# Patient Record
Sex: Male | Born: 1995 | Race: White | Hispanic: No | Marital: Single | Smoking: Current some day smoker
Health system: Southern US, Community
[De-identification: ages and names within clinical notes are randomized; demographics above are authoritative.]

## PROBLEM LIST (undated history)

## (undated) HISTORY — PX: KNEE SURGERY: SHX244

## (undated) HISTORY — PX: OTHER SURGICAL HISTORY: SHX169

---
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
## 2014-02-06 ENCOUNTER — Ambulatory Visit (INDEPENDENT_AMBULATORY_CARE_PROVIDER_SITE_OTHER): Payer: Medicaid Other | Admitting: Family Medicine

## 2014-02-06 VITALS — BP 125/65 | HR 71 | Temp 98.6°F | Wt 141.0 lb

## 2014-02-06 DIAGNOSIS — G8929 Other chronic pain: Secondary | ICD-10-CM

## 2014-02-06 DIAGNOSIS — M25561 Pain in right knee: Secondary | ICD-10-CM

## 2014-02-06 MED ORDER — NAPROXEN 500 MG PO TABS: 500.0000 mg | ORAL_TABLET | Freq: Two times a day (BID) | ORAL | Status: DC

## 2014-02-06 MED ORDER — HYDROCODONE-ACETAMINOPHEN 5-325 MG PO TABS: 1.0000 | ORAL_TABLET | Freq: Four times a day (QID) | ORAL | Status: DC | PRN

## 2014-02-06 NOTE — Progress Notes (Signed)
   Subjective:    Patient ID: Eddie Perry, male    DOB: 1995/08/30, 18 y.o.   MRN: [id number]  HPI  C/o chronic right knee pain.  He was originally seen for this in 2013 and states he had surgery and now the knee is acting up again. Review of Systems  Constitutional: Negative for fever.  HENT: Negative for ear pain.   Eyes: Negative for discharge.  Respiratory: Negative for cough.   Cardiovascular: Negative for chest pain.  Gastrointestinal: Negative for abdominal distention.  Endocrine: Negative for polyuria.  Genitourinary: Negative for difficulty urinating.  Musculoskeletal: Negative for gait problem and neck pain.  Skin: Negative for color change and rash.  Neurological: Negative for speech difficulty and headaches.  Psychiatric/Behavioral: Negative for agitation.       Objective:    BP 125/65 mmHg  Pulse 71  Temp(Src) 98.6 F (37 C) (Oral)  Wt 141 lb (63.957 kg) Physical Exam  Constitutional: He is oriented to person, place, and time. He appears well-developed and well-nourished.  HENT:  Head: Normocephalic and atraumatic.  Mouth/Throat: Oropharynx is clear and moist.  Eyes: Pupils are equal, round, and reactive to light.  Neck: Normal range of motion. Neck supple.  Cardiovascular: Normal rate and regular rhythm.   No murmur heard. Pulmonary/Chest: Effort normal and breath sounds normal.  Abdominal: Soft. Bowel sounds are normal. There is no tenderness.  Neurological: He is alert and oriented to person, place, and time.  Skin: Skin is warm and dry.  Psychiatric: He has a normal mood and affect.          Assessment & Plan:     ICD-9-CM ICD-10-CM   1. Knee pain, chronic, right 719.46 M25.561 Ambulatory referral to Orthopedic Surgery   338.29 G89.29 HYDROcodone-acetaminophen (NORCO/VICODIN) 5-325 MG per tablet     naproxen (NAPROSYN) 500 MG tablet     No Follow-up on file.  Deatra CanterWilliam J Oxford FNP

## 2014-02-06 NOTE — Progress Notes (Signed)
   Subjective:    Patient ID: Eddie Perry, male    DOB: Jun 30, 1995, 18 y.o.   MRN: [id number]  HPI C/o right knee pain and discomfort and when he steps down he has a crack and pop.  He has had orthoscopic surgery in 2013. He has been having discomfort whic has wosened.  Review of Systems     Objective:    BP 125/65 mmHg  Pulse 71  Temp(Src) 98.6 F (37 C) (Oral)  Wt 141 lb (63.957 kg) Physical Exam        Assessment & Plan:  No diagnosis found.   No Follow-up on file.  Deatra CanterWilliam J Oxford FNP

## 2014-02-22 ENCOUNTER — Other Ambulatory Visit (HOSPITAL_COMMUNITY): Payer: Self-pay | Admitting: Orthopedic Surgery

## 2014-02-22 ENCOUNTER — Ambulatory Visit (HOSPITAL_COMMUNITY)
Admission: RE | Admit: 2014-02-22 | Discharge: 2014-02-22 | Disposition: A | Discharge status: Home / Self Care | Payer: Medicaid Other | Source: Ambulatory Visit | Attending: Orthopedic Surgery | Admitting: Orthopedic Surgery

## 2014-02-22 DIAGNOSIS — M25561 Pain in right knee: Secondary | ICD-10-CM

## 2014-02-22 DIAGNOSIS — Z01818 Encounter for other preprocedural examination: Secondary | ICD-10-CM | POA: Diagnosis present

## 2014-07-11 ENCOUNTER — Ambulatory Visit: Payer: Medicaid Other | Admitting: Nurse Practitioner

## 2014-08-02 ENCOUNTER — Ambulatory Visit: Payer: Medicaid Other | Admitting: Nurse Practitioner

## 2014-08-23 ENCOUNTER — Ambulatory Visit (INDEPENDENT_AMBULATORY_CARE_PROVIDER_SITE_OTHER): Payer: Medicaid Other | Admitting: Family

## 2014-08-23 ENCOUNTER — Encounter: Payer: Self-pay | Admitting: Family

## 2014-08-23 VITALS — BP 115/70 | HR 72 | Temp 97.5°F | Ht 66.0 in | Wt 134.8 lb

## 2014-08-23 DIAGNOSIS — R112 Nausea with vomiting, unspecified: Secondary | ICD-10-CM | POA: Diagnosis not present

## 2014-08-23 DIAGNOSIS — R52 Pain, unspecified: Secondary | ICD-10-CM | POA: Diagnosis not present

## 2014-08-23 DIAGNOSIS — R1011 Right upper quadrant pain: Secondary | ICD-10-CM | POA: Diagnosis not present

## 2014-08-23 LAB — POCT CBC
GRANULOCYTE PERCENT: 68.5 % (ref 37–80)
HCT, POC: 48.2 % (ref 43.5–53.7)
Hemoglobin: 15.2 g/dL (ref 14.1–18.1)
Lymph, poc: 2.1 (ref 0.6–3.4)
MCH, POC: 26.5 pg — AB (ref 27–31.2)
MCHC: 31.5 g/dL — AB (ref 31.8–35.4)
MCV: 84 fL (ref 80–97)
MPV: 8 fL (ref 0–99.8)
PLATELET COUNT, POC: 251 10*3/uL (ref 142–424)
POC GRANULOCYTE: 5.4 (ref 2–6.9)
POC LYMPH %: 26.5 % (ref 10–50)
RBC: 5.74 M/uL (ref 4.69–6.13)
RDW, POC: 13.9 %
WBC: 7.9 10*3/uL (ref 4.6–10.2)

## 2014-08-23 LAB — POCT INFLUENZA A/B
Influenza A, POC: NEGATIVE
Influenza B, POC: NEGATIVE

## 2014-08-23 NOTE — Progress Notes (Signed)
   Subjective:    Patient ID: Eddie Perry, male    DOB: 1995-11-27, 19 y.o.   MRN: [id number]  HPI Pt presents to the office today for generalized weakness, nausea, vomiting, stomach pain, and chills that started last night. Pt denies any fevers. Pt states he is having a intermittent cramping abdomen pain. Pt states it "feels like someone hitting my in the stomach". Pt denies any  palpitations, SOB, or edema at this time.     Review of Systems  Constitutional: Negative.   HENT: Negative.   Respiratory: Negative.   Cardiovascular: Negative.   Gastrointestinal: Negative.   Endocrine: Negative.   Genitourinary: Negative.   Musculoskeletal: Negative.   Neurological: Negative.   Hematological: Negative.   Psychiatric/Behavioral: Negative.   All other systems reviewed and are negative.      Objective:   Physical Exam  Constitutional: He is oriented to person, place, and time. He appears well-developed and well-nourished. He has a sickly appearance. He appears ill. No distress.  HENT:  Head: Normocephalic.  Eyes: Pupils are equal, round, and reactive to light. Right eye exhibits no discharge. Left eye exhibits no discharge.  Neck: Normal range of motion. Neck supple. No thyromegaly present.  Cardiovascular: Normal rate, regular rhythm, normal heart sounds and intact distal pulses.   No murmur heard. Pulmonary/Chest: Effort normal and breath sounds normal. No respiratory distress. He has no wheezes.  Abdominal: Soft. Bowel sounds are normal. He exhibits no distension. There is tenderness (RUQ pain). There is rebound and guarding.  Musculoskeletal: Normal range of motion. He exhibits no edema or tenderness.  Neurological: He is alert and oriented to person, place, and time. He has normal reflexes. No cranial nerve deficit.  Skin: Skin is warm and dry. No rash noted. No erythema.  Psychiatric: He has a normal mood and affect. His behavior is normal. Judgment and thought content  normal.  Vitals reviewed.     BP 115/70 mmHg  Pulse 72  Temp(Src) 97.5 F (36.4 C) (Oral)  Ht $R'5\' 6"'kj$  (1.676 m)  Wt 134 lb 12.8 oz (61.145 kg)  BMI 21.77 kg/m2     Assessment & Plan:  1. Body aches - POCT Influenza A/B - CT Abdomen Wo Contrast; Future - US Abdomen Complete; Future  2. RUQ pain - POCT CBC - CMP14+EGFR - CT Abdomen Wo Contrast; Future - US Abdomen Complete; Future  3. Non-intractable vomiting with nausea, vomiting of unspecified type - CT Abdomen Wo Contrast; Future - US Abdomen Complete; Future  NPO after midnight for Ultrasound at 9 Am tomorrow If abd pain worsens or changes pt told to go to ED  Evelina Dun, FNP

## 2014-08-23 NOTE — Patient Instructions (Signed)
Cholecystitis Cholecystitis is an inflammation of your gallbladder. It is usually caused by a buildup of gallstones or sludge (cholelithiasis) in your gallbladder. The gallbladder stores a fluid that helps digest fats (bile). Cholecystitis is serious and needs treatment right away.  CAUSES   Gallstones. Gallstones can block the tube that leads to your gallbladder, causing bile to build up. As bile builds up, the gallbladder becomes inflamed.  Bile duct problems, such as blockage from scarring or kinking.  Tumors. Tumors can stop bile from leaving your gallbladder correctly, causing bile to build up. As bile builds up, the gallbladder becomes inflamed. SYMPTOMS   Nausea.  Vomiting.  Abdominal pain, especially in the upper right area of your abdomen.  Abdominal tenderness or bloating.  Sweating.  Chills.  Fever.  Yellowing of the skin and the whites of the eyes (jaundice). DIAGNOSIS  Your caregiver may order blood tests to look for infection or gallbladder problems. Your caregiver may also order imaging tests, such as an ultrasound or computed tomography (CT) scan. Further tests may include a hepatobiliary iminodiacetic acid (HIDA) scan. This scan allows your caregiver to see your bile move from the liver to the gallbladder and to the small intestine. TREATMENT  A hospital stay is usually necessary to lessen the inflammation of your gallbladder. You may be required to not eat or drink (fast) for a certain amount of time. You may be given medicine to treat pain or an antibiotic medicine to treat an infection. Surgery may be needed to remove your gallbladder (cholecystectomy) once the inflammation has gone down. Surgery may be needed right away if you develop complications such as death of gallbladder tissue (gangrene) or a tear (perforation) of the gallbladder.  HOME CARE INSTRUCTIONS  Home care will depend on your treatment. In general:  If you were given antibiotics, take them as  directed. Finish them even if you start to feel better.  Only take over-the-counter or prescription medicines for pain, discomfort, or fever as directed by your caregiver.  Follow a low-fat diet until you see your caregiver again.  Keep all follow-up visits as directed by your caregiver. SEEK IMMEDIATE MEDICAL CARE IF:   Your pain is increasing and not controlled by medicines.  Your pain moves to another part of your abdomen or to your back.  You have a fever.  You have nausea and vomiting. MAKE SURE YOU:  Understand these instructions.  Will watch your condition.  Will get help right away if you are not doing well or get worse. Document Released: 03/24/2005 Document Revised: 06/16/2011 Document Reviewed: 02/07/2011 ExitCare Patient Information 2015 [employer]. This information is not intended to replace advice given to you by your health care provider. Make sure you discuss any questions you have with your health care provider.   Abdominal Pain Many things can cause abdominal pain. Usually, abdominal pain is not caused by a disease and will improve without treatment. It can often be observed and treated at home. Your health care provider will do a physical exam and possibly order blood tests and X-rays to help determine the seriousness of your pain. However, in many cases, more time must pass before a clear cause of the pain can be found. Before that point, your health care provider may not know if you need more testing or further treatment. HOME CARE INSTRUCTIONS  Monitor your abdominal pain for any changes. The following actions may help to alleviate any discomfort you are experiencing:  Only take over-the-counter or prescription medicines   as directed by your health care provider.  Do not take laxatives unless directed to do so by your health care provider.  Try a clear liquid diet (broth, tea, or water) as directed by your health care provider. Slowly move to a bland diet  as tolerated. SEEK MEDICAL CARE IF:  You have unexplained abdominal pain.  You have abdominal pain associated with nausea or diarrhea.  You have pain when you urinate or have a bowel movement.  You experience abdominal pain that wakes you in the night.  You have abdominal pain that is worsened or improved by eating food.  You have abdominal pain that is worsened with eating fatty foods.  You have a fever. SEEK IMMEDIATE MEDICAL CARE IF:   Your pain does not go away within 2 hours.  You keep throwing up (vomiting).  Your pain is felt only in portions of the abdomen, such as the right side or the left lower portion of the abdomen.  You pass bloody or black tarry stools. MAKE SURE YOU:  Understand these instructions.   Will watch your condition.   Will get help right away if you are not doing well or get worse.  Document Released: 01/01/2005 Document Revised: 03/29/2013 Document Reviewed: 12/01/2012 ExitCare Patient Information 2015 [employer]. This information is not intended to replace advice given to you by your health care provider. Make sure you discuss any questions you have with your health care provider.   

## 2014-08-24 ENCOUNTER — Telehealth: Payer: Self-pay | Admitting: Family

## 2014-08-24 ENCOUNTER — Ambulatory Visit (HOSPITAL_COMMUNITY)
Admission: RE | Admit: 2014-08-24 | Discharge: 2014-08-24 | Disposition: A | Discharge status: Home / Self Care | Payer: Medicaid Other | Source: Ambulatory Visit | Attending: Family | Admitting: Family

## 2014-08-24 DIAGNOSIS — R112 Nausea with vomiting, unspecified: Secondary | ICD-10-CM | POA: Diagnosis not present

## 2014-08-24 DIAGNOSIS — R52 Pain, unspecified: Secondary | ICD-10-CM | POA: Diagnosis not present

## 2014-08-24 DIAGNOSIS — R1011 Right upper quadrant pain: Secondary | ICD-10-CM

## 2014-08-24 LAB — CMP14+EGFR
ALBUMIN: 4.8 g/dL (ref 3.5–5.5)
ALT: 13 IU/L (ref 0–44)
AST: 24 IU/L (ref 0–40)
Albumin/Globulin Ratio: 2 (ref 1.1–2.5)
Alkaline Phosphatase: 64 IU/L (ref 39–117)
BUN / CREAT RATIO: 10 (ref 8–19)
BUN: 10 mg/dL (ref 6–20)
Bilirubin Total: 0.7 mg/dL (ref 0.0–1.2)
CHLORIDE: 100 mmol/L (ref 97–108)
CO2: 24 mmol/L (ref 18–29)
CREATININE: 0.97 mg/dL (ref 0.76–1.27)
Calcium: 10.1 mg/dL (ref 8.7–10.2)
GFR calc Af Amer: 129 mL/min/{1.73_m2} (ref >59)
GFR, EST NON AFRICAN AMERICAN: 112 mL/min/{1.73_m2} (ref >59)
GLUCOSE: 92 mg/dL (ref 65–99)
Globulin, Total: 2.4 g/dL (ref 1.5–4.5)
Potassium: 4.8 mmol/L (ref 3.5–5.2)
Sodium: 141 mmol/L (ref 134–144)
TOTAL PROTEIN: 7.2 g/dL (ref 6.0–8.5)

## 2014-08-24 NOTE — Telephone Encounter (Signed)
Informed that Eddie Perry is not in today  We will call with results after reviewed by provider

## 2014-10-05 ENCOUNTER — Telehealth: Payer: Self-pay | Admitting: Family Medicine

## 2014-10-05 NOTE — Telephone Encounter (Signed)
Seen at urgent care yesterday for hand fracture because we did not have any available appointments. He needs an appt with an ortho. We do not have any appointments available today either. I will request records from urgent care and place referral.  We will contact them once everything is scheduled.   Records received from urgent care. Patient has a small avulsion fracture of the 2nd metacarpal.  Referring to Dr Case per mother's request.

## 2014-10-05 NOTE — Telephone Encounter (Signed)
Pt's mother aware of appointment date/time as per DPR of 08/2014  10/06/2014 at 9:45 am Dr. [place] [facility]

## 2014-10-26 ENCOUNTER — Ambulatory Visit (INDEPENDENT_AMBULATORY_CARE_PROVIDER_SITE_OTHER): Payer: Medicaid Other | Admitting: Nurse Practitioner

## 2014-10-26 ENCOUNTER — Encounter: Payer: Self-pay | Admitting: Nurse Practitioner

## 2014-10-26 VITALS — BP 138/75 | HR 57 | Temp 98.6°F | Ht 66.0 in | Wt 133.0 lb

## 2014-10-26 DIAGNOSIS — Z00129 Encounter for routine child health examination without abnormal findings: Secondary | ICD-10-CM

## 2014-10-26 DIAGNOSIS — Z0000 Encounter for general adult medical examination without abnormal findings: Secondary | ICD-10-CM

## 2014-10-26 NOTE — Patient Instructions (Signed)

## 2014-10-26 NOTE — Progress Notes (Signed)
   Subjective:    Patient ID: Eddie Perry, male    DOB: [birth year], 19 y.o.   MRN: [id number]  HPI Patient in today for annual physical exam- he is doing well today without complaints- he has no chronic medical problems and is on no meds.    Review of Systems  Constitutional: Negative.   HENT: Negative.   Respiratory: Negative.   Cardiovascular: Negative.   Gastrointestinal: Negative.   Genitourinary: Negative.   Musculoskeletal: Negative.   Neurological: Negative.   Psychiatric/Behavioral: Negative.   All other systems reviewed and are negative.      Objective:   Physical Exam  Constitutional: He is oriented to person, place, and time. He appears well-developed and well-nourished.  HENT:  Head: Normocephalic.  Right Ear: External ear normal.  Left Ear: External ear normal.  Nose: Nose normal.  Mouth/Throat: Oropharynx is clear and moist.  Eyes: EOM are normal. Pupils are equal, round, and reactive to light.  Neck: Normal range of motion. Neck supple. No JVD present. No thyromegaly present.  Cardiovascular: Normal rate, regular rhythm, normal heart sounds and intact distal pulses.  Exam reveals no gallop and no friction rub.   No murmur heard. Pulmonary/Chest: Effort normal and breath sounds normal. No respiratory distress. He has no wheezes. He has no rales. He exhibits no tenderness.  Abdominal: Soft. Bowel sounds are normal. He exhibits no mass. There is no tenderness.  Genitourinary: Penis normal. No penile tenderness.  Musculoskeletal: Normal range of motion. He exhibits no edema.  Lymphadenopathy:    He has no cervical adenopathy.  Neurological: He is alert and oriented to person, place, and time. No cranial nerve deficit.  Skin: Skin is warm and dry.  Psychiatric: He has a normal mood and affect. His behavior is normal. Judgment and thought content normal.   BP 138/75 mmHg  Pulse 57  Temp(Src) 98.6 F (37 C) (Oral)  Ht  (1.676 m)  Wt 133 lb (60.328 kg)   BMI 21.48 kg/m2        Assessment & Plan:  1. Annual physical exam  Patient refused lab work Health maintenance reviewed Diet and exercise encouraged Continue all meds Follow up  In 1 year   Mary-Margaret Daphine Deutscher, FNP

## 2015-01-17 ENCOUNTER — Ambulatory Visit (INDEPENDENT_AMBULATORY_CARE_PROVIDER_SITE_OTHER): Payer: Medicaid Other | Admitting: Pediatrics

## 2015-01-17 ENCOUNTER — Encounter: Payer: Self-pay | Admitting: Pediatrics

## 2015-01-17 VITALS — BP 128/77 | HR 46 | Temp 97.6°F | Ht 66.0 in | Wt 139.2 lb

## 2015-01-17 DIAGNOSIS — J069 Acute upper respiratory infection, unspecified: Secondary | ICD-10-CM | POA: Diagnosis not present

## 2015-01-17 DIAGNOSIS — J029 Acute pharyngitis, unspecified: Secondary | ICD-10-CM | POA: Diagnosis not present

## 2015-01-17 LAB — POCT RAPID STREP A (OFFICE): Rapid Strep A Screen: NEGATIVE

## 2015-01-17 NOTE — Progress Notes (Signed)
    Subjective:    Patient ID: Eddie Perry, male    DOB: 1995-10-07, 19 y.o.   MRN: [id number]  CC: sore throat this am  HPI: Eddie Perry is a 19 y.o. male presenting on 01/17/2015 for Sore Throat  Sore throat starting this morning at 430am. Bad headache this morning, tylenol helped some. Sister had stomach virus and mom with congestion No runny nose, no cough now. No recent illnesses or antibiotics.  Relevant past medical, surgical, family and social history reviewed and updated as indicated. Interim medical history since our last visit reviewed. Allergies and medications reviewed and updated.   ROS: Per HPI unless specifically indicated above  Past Medical History There are no active problems to display for this patient.   No current outpatient prescriptions on file.   No current facility-administered medications for this visit.       Objective:    BP 128/77 mmHg  Pulse 46  Temp(Src) 97.6 F (36.4 C) (Oral)  Ht 5\' 6"  (1.676 m)  Wt 139 lb 3.2 oz (63.141 kg)  BMI 22.48 kg/m2  Wt Readings from Last 3 Encounters:  01/17/15 139 lb 3.2 oz (63.141 kg)  10/26/14 133 lb (60.328 kg)  08/23/14 134 lb 12.8 oz (61.145 kg)     Gen: NAD, alert, cooperative with exam, NCAT EYES: EOMI, no scleral injection or icterus ENT:  TMs pearly gray b/l, OP with some erythema, no tonsillar exudates LYMPH: small < 1cm cervical LAD CV: NRRR, normal S1/S2, no murmur, distal pulses 2+ b/l Resp: CTABL, no wheezes, normal WOB Ext: No edema, warm Neuro: Alert and oriented     Assessment & Plan:   Eddie Perry was seen today for sore throat that started this morning, rapid strep negative, likely due to viral syndrome. Several other family members ill. Discussed he may feel worse over next few days, should start to feel better by day 7. Discussed symptomatic care. Pt and mom in agreement with plan.  Diagnoses and all orders for this visit:  Sore throat -     POCT rapid strep  A   Follow up plan: Prn if symptoms dont improve  Rex Krasarol Marietta Sikkema, MD [facility] 01/17/2015, 12:08 PM

## 2015-01-17 NOTE — Patient Instructions (Signed)
Ibuprofen 600mg  three times a day as needed for congestion and throat pain Lots of fluids

## 2015-01-21 LAB — CULTURE, GROUP A STREP

## 2015-02-12 ENCOUNTER — Ambulatory Visit (INDEPENDENT_AMBULATORY_CARE_PROVIDER_SITE_OTHER): Payer: Medicaid Other | Admitting: Family Medicine

## 2015-02-12 ENCOUNTER — Ambulatory Visit (INDEPENDENT_AMBULATORY_CARE_PROVIDER_SITE_OTHER): Payer: Medicaid Other

## 2015-02-12 ENCOUNTER — Encounter: Payer: Self-pay | Admitting: Family Medicine

## 2015-02-12 VITALS — BP 122/66 | HR 74 | Temp 97.5°F | Ht 66.0 in | Wt 140.0 lb

## 2015-02-12 DIAGNOSIS — M79641 Pain in right hand: Secondary | ICD-10-CM

## 2015-02-12 DIAGNOSIS — S60221A Contusion of right hand, initial encounter: Secondary | ICD-10-CM

## 2015-02-12 NOTE — Patient Instructions (Signed)
Use ice to the injured fingers for at least 48 hours and then warm wet compresses Keep the abrasions cleansed frequently and apply Polysporin ointment Splint the 2 fingers together with Coban for the next 2-3 days but you can remove the Coban and reapply if you're taking a bath or shower Take ibuprofen 200 mg 4 times daily after eating for pain and inflammation Elevate the injured hand as much as possible as this will reduce the throbbing and swelling

## 2015-02-12 NOTE — Progress Notes (Signed)
   Subjective:    Patient ID: Eddie Perry, male    DOB: 1996-02-27, 19 y.o.   MRN: [id number]  HPI Patient here today for right hand pain. He was "taking a car apart" when his hand hit the floor. Most of the pain is in the first two fingers of right hand. He is accompanied today by his mother.       There are no active problems to display for this patient.  No outpatient encounter prescriptions on file as of 02/12/2015.   No facility-administered encounter medications on file as of 02/12/2015.     Review of Systems  Constitutional: Negative.   HENT: Negative.   Eyes: Negative.   Respiratory: Negative.   Cardiovascular: Negative.   Gastrointestinal: Negative.   Endocrine: Negative.   Genitourinary: Negative.   Musculoskeletal: Positive for arthralgias (right hand pain).  Skin: Negative.   Allergic/Immunologic: Negative.   Neurological: Negative.   Hematological: Negative.   Psychiatric/Behavioral: Negative.        Objective:   Physical Exam  Constitutional: He is oriented to person, place, and time. He appears well-developed and well-nourished. No distress.  HENT:  Head: Normocephalic.  Musculoskeletal: He exhibits edema and tenderness.  There is stiffness and swelling in the fingers of the right hand. There are abrasions over the PIP joints of the second and third fingers. The patient indicates this is where most of the stiffness and soreness that is located area he does have some flexibility but limited because of the swelling and stiffness. The wounds will be cleansed and the dressing will be applied and the 2 fingers will be wrapped together and he is to wear this for a couple of days. He can remove it to take a shower or bath.  Neurological: He is alert and oriented to person, place, and time.  Skin: Skin is warm and dry. There is erythema. No pallor.  Abrasions of the dorsal PIP joints of the second and third fingers of the right hand these were cleansed and  dressed before wrapping with bandage  Psychiatric: He has a normal mood and affect. His behavior is normal. Judgment and thought content normal.  Nursing note and vitals reviewed.   BP 122/66 mmHg  Pulse 74  Temp(Src) 97.5 F (36.4 C) (Oral)  Ht 5\' 6"  (1.676 m)  Wt 140 lb (63.504 kg)  BMI 22.61 kg/m2       Assessment & Plan:  1. Right hand pain -Take ibuprofen after each meal and at bedtime after snack for pain and inflammation - DG Hand Complete Right; Future  2. Hand contusion, right, initial encounter -Use ice for the first 48 hours and warm wet compresses and keep abrasions cleaned with warm soapy water and apply Polysporin ointment after cleansing and reapply dressing holding the 2 fingers together for the next couple of days  Patient Instructions  Use ice to the injured fingers for at least 48 hours and then warm wet compresses Keep the abrasions cleansed frequently and apply Polysporin ointment Splint the 2 fingers together with Coban for the next 2-3 days but you can remove the Coban and reapply if you're taking a bath or shower Take ibuprofen 200 mg 4 times daily after eating for pain and inflammation Elevate the injured hand as much as possible as this will reduce the throbbing and swelling    Nyra Capeson W. Moore MD

## 2015-02-16 ENCOUNTER — Telehealth: Payer: Self-pay | Admitting: Family Medicine

## 2015-02-16 NOTE — Telephone Encounter (Signed)
Pt aware it aware

## 2015-05-22 ENCOUNTER — Encounter: Payer: Self-pay | Admitting: Family Medicine

## 2015-05-22 ENCOUNTER — Ambulatory Visit (INDEPENDENT_AMBULATORY_CARE_PROVIDER_SITE_OTHER): Payer: Medicaid Other | Admitting: Family Medicine

## 2015-05-22 VITALS — BP 113/68 | HR 68 | Temp 97.8°F | Ht 66.0 in | Wt 137.2 lb

## 2015-05-22 DIAGNOSIS — J029 Acute pharyngitis, unspecified: Secondary | ICD-10-CM | POA: Diagnosis not present

## 2015-05-22 DIAGNOSIS — K529 Noninfective gastroenteritis and colitis, unspecified: Secondary | ICD-10-CM

## 2015-05-22 LAB — POCT RAPID STREP A (OFFICE): Rapid Strep A Screen: NEGATIVE

## 2015-05-22 LAB — POCT INFLUENZA A/B
INFLUENZA B, POC: NEGATIVE
Influenza A, POC: NEGATIVE

## 2015-05-22 MED ORDER — DIPHENOXYLATE-ATROPINE 2.5-0.025 MG PO TABS: 2.0000 | ORAL_TABLET | Freq: Four times a day (QID) | ORAL | Status: DC | PRN

## 2015-05-22 NOTE — Progress Notes (Signed)
   Subjective:  Patient ID: Eddie Perry, male    DOB: 12-15-1995  Age: 20 y.o. MRN: [id number]  CC: Sore Throat and Diarrhea   HPI Eddie Perry presents for 5-6 LBM today with abd discomfort. Able to hold down food. Pursue nml work. No fever. No cough. No myalgia. Vague sore throat at back right tonsilar area.  History Eddie Perry has no past medical history on file.   Eddie Perry has past surgical history that includes Knee surgery and hypospadias surgery.   His family history is not on file.Eddie Perry reports that Eddie Perry has never smoked. Eddie Perry does not have any smokeless tobacco history on file. Eddie Perry reports that Eddie Perry does not drink alcohol or use illicit drugs.  No current outpatient prescriptions on file prior to visit.   No current facility-administered medications on file prior to visit.    ROS Review of Systems  Constitutional: Negative for fever, chills and diaphoresis.  HENT: Negative for rhinorrhea and sore throat.   Respiratory: Negative for cough and shortness of breath.   Cardiovascular: Negative for chest pain.  Gastrointestinal: Positive for abdominal pain and diarrhea. Negative for nausea and blood in stool.  Musculoskeletal: Negative for myalgias and arthralgias.  Skin: Negative for rash.  Neurological: Negative for weakness and headaches.    Objective:  BP 113/68 mmHg  Pulse 68  Temp(Src) 97.8 F (36.6 C) (Oral)  Ht  (1.676 m)  Wt 137 lb 3.2 oz (62.234 kg)  BMI 22.16 kg/m2  SpO2 99%  Physical Exam  Constitutional: Eddie Perry appears well-developed and well-nourished.  HENT:  Head: Normocephalic and atraumatic.  Right Ear: Tympanic membrane and external ear normal. No decreased hearing is noted.  Left Ear: Tympanic membrane and external ear normal. No decreased hearing is noted.  Mouth/Throat: No oropharyngeal exudate or posterior oropharyngeal erythema.  Eyes: Pupils are equal, round, and reactive to light.  Neck: Normal range of motion. Neck supple.  Cardiovascular: Normal  rate and regular rhythm.   No murmur heard. Pulmonary/Chest: Breath sounds normal. No respiratory distress.  Abdominal: Soft. Bowel sounds are normal. Eddie Perry exhibits no mass. There is no tenderness.  Vitals reviewed.   Assessment & Plan:   Eddie Perry was seen today for sore throat and diarrhea.  Diagnoses and all orders for this visit:  Gastroenteritis  Sore throat -     POCT Influenza A/B -     POCT rapid strep A  Other orders -     diphenoxylate-atropine (LOMOTIL) 2.5-0.025 MG tablet; Take 2 tablets by mouth 4 (four) times daily as needed for diarrhea or loose stools.   I am having Eddie Perry start on diphenoxylate-atropine.  Meds ordered this encounter  Medications  . diphenoxylate-atropine (LOMOTIL) 2.5-0.025 MG tablet    Sig: Take 2 tablets by mouth 4 (four) times daily as needed for diarrhea or loose stools.    Dispense:  30 tablet    Refill:  0     Follow-up: Return if symptoms worsen or fail to improve.  Mechele Claude, M.D.

## 2015-05-22 NOTE — Patient Instructions (Signed)
Diet for Diarrhea . Choosing the right foods can help ease discomfort caused by these symptoms.   WHAT GENERAL GUIDELINES DO I NEED TO FOLLOW?    Avoid foods that cause symptoms.Drink enough fluids to keep your urine clear or pale yellow.  , try making your meals low in fat and high in carbohydrates. Examples of carbohydrates are pasta, rice, whole grain breads and cereals, fruits, and vegetables.  If dairy products cause your diarrhea to flare up, try eating less of them. You might be able to handle yogurt better than other dairy products because it contains bacteria that help with digestion. WHAT FOODS ARE NOT RECOMMENDED? The following are some foods and drinks that may worsen your symptoms:  Fatty foods, such as [place] fries.  Milk products, such as cheese or ice cream.  Chocolate.  Alcohol.  Products with caffeine, such as coffee.  Carbonated drinks, such as soda. The items listed above may not be a complete list of foods and beverages to avoid. Contact your dietitian for more information. WHAT FOODS ARE GOOD SOURCES OF FIBER? Your health care provider or dietitian may recommend that you eat more foods that contain fiber. Fiber can help reduce constipation and other IBS symptoms. Add foods with fiber to your diet a little at a time so that your body can get used to them. Too much fiber at once might cause gas and swelling of your abdomen. The following are some foods that are good sources of fiber:  Apples.  Peaches.  Pears.  Berries.  Figs.  Broccoli (raw).  Cabbage.  Carrots.  Raw peas.  Kidney beans.  Lima beans.  Whole grain bread.  Whole grain cereal. FOR MORE INFORMATION  International Foundation for Functional Gastrointestinal Disorders: www.iffgd.[employer] of Diabetes and Digestive and Kidney Diseases: http://norris-lawson.com/.aspx   This information is not intended to  replace advice given to you by your health care provider. Make sure you discuss any questions you have with your health care provider.   Document Released: 06/14/2003 Document Revised: 04/14/2014 Document Reviewed: 06/24/2013 Elsevier Interactive Patient Education [employer]

## 2015-06-11 ENCOUNTER — Ambulatory Visit: Payer: Self-pay | Admitting: Nurse Practitioner

## 2015-06-11 ENCOUNTER — Encounter: Payer: Self-pay | Admitting: Nurse Practitioner

## 2015-06-11 VITALS — BP 121/73 | HR 112 | Temp 97.6°F | Ht 66.0 in | Wt 138.0 lb

## 2015-06-11 DIAGNOSIS — R059 Cough, unspecified: Secondary | ICD-10-CM

## 2015-06-11 DIAGNOSIS — R05 Cough: Secondary | ICD-10-CM

## 2015-06-11 LAB — VERITOR FLU A/B WAIVED
INFLUENZA A: POSITIVE — AB
INFLUENZA B: NEGATIVE

## 2015-06-11 NOTE — Progress Notes (Signed)
  Subjective:     Dahlia ByesJamie A Petsch is a 20 y.o. male who presents for evaluation of sinus pain. Symptoms include: congestion, cough, nasal congestion and sore throat. Onset of symptoms was 2 weeks ago. Symptoms have been gradually worsening since that time. Past history is significant for no history of pneumonia or bronchitis. Patient is a non-smoker.  The following portions of the patient's history were reviewed and updated as appropriate: allergies, current medications, past family history, past medical history, past social history, past surgical history and problem list. Was exposed to flu last week.  Review of Systems Pertinent items are noted in HPI.   Objective:    BP 121/73 mmHg  Pulse 112  Temp(Src) 97.6 F (36.4 C) (Oral)  Ht 5\' 6"  (1.676 m)  Wt 138 lb (62.596 kg)  BMI 22.28 kg/m2 General appearance: alert and cooperative Eyes: conjunctivae/corneas clear. PERRL, EOM's intact. Fundi benign. Ears: normal TM's and external ear canals both ears Nose: clear discharge, moderate congestion, turbinates pale, no sinus tenderness Throat: lips, mucosa, and tongue normal; teeth and gums normal Neck: no adenopathy, no carotid bruit, no JVD, supple, symmetrical, trachea midline and thyroid not enlarged, symmetric, no tenderness/mass/nodules Lungs: clear to auscultation bilaterally and deep dry cough Heart: regular rate and rhythm, S1, S2 normal, no murmur, click, rub or gallop    Influenza A positive   Assessment:     influenza with cough .    Plan:   1. Take meds as prescribed 2. Use a cool mist humidifier especially during the winter months and when heat has been humid. 3. Use saline nose sprays frequently 4. Saline irrigations of the nose can be very helpful if done frequently.  * 4X daily for 1 week*  * Use of a nettie pot can be helpful with this. Follow directions with this* 5. Drink plenty of fluids 6. Keep thermostat turn down low 7.For any cough or congestion  Use plain  Mucinex- regular strength or max strength is fine or delsym OTC   * Children- consult with Pharmacist for dosing 8. For fever or aces or pains- take tylenol or ibuprofen appropriate for age and weight.  * for fevers greater than 101 orally you may alternate ibuprofen and tylenol every  3 hours.     Mary-Margaret Daphine DeutscherMartin, FNP

## 2015-06-11 NOTE — Patient Instructions (Signed)
1. Take meds as prescribed 2. Use a cool mist humidifier especially during the winter months and when heat has been humid. 3. Use saline nose sprays frequently 4. Saline irrigations of the nose can be very helpful if done frequently.  * 4X daily for 1 week*  * Use of a nettie pot can be helpful with this. Follow directions with this* 5. Drink plenty of fluids 6. Keep thermostat turn down low 7.For any cough or congestion  Use plain Mucinex- regular strength or max strength is fine or delsym OTC   * Children- consult with Pharmacist for dosing 8. For fever or aces or pains- take tylenol or ibuprofen appropriate for age and weight.  * for fevers greater than 101 orally you may alternate ibuprofen and tylenol every  3 hours.

## 2015-06-18 ENCOUNTER — Encounter: Payer: Medicaid Other | Admitting: Nurse Practitioner

## 2015-08-06 ENCOUNTER — Encounter: Payer: Self-pay | Admitting: *Deleted

## 2015-08-06 ENCOUNTER — Encounter (INDEPENDENT_AMBULATORY_CARE_PROVIDER_SITE_OTHER): Payer: Self-pay

## 2015-09-07 ENCOUNTER — Emergency Department (HOSPITAL_COMMUNITY): Payer: Self-pay

## 2015-09-07 ENCOUNTER — Emergency Department (HOSPITAL_COMMUNITY)
Admission: EM | Admit: 2015-09-07 | Discharge: 2015-09-08 | Disposition: A | Discharge status: Home / Self Care | Payer: Self-pay | Attending: Emergency Medicine | Admitting: Emergency Medicine

## 2015-09-07 ENCOUNTER — Encounter (HOSPITAL_COMMUNITY): Payer: Self-pay | Admitting: *Deleted

## 2015-09-07 DIAGNOSIS — Y929 Unspecified place or not applicable: Secondary | ICD-10-CM | POA: Insufficient documentation

## 2015-09-07 DIAGNOSIS — Y999 Unspecified external cause status: Secondary | ICD-10-CM | POA: Insufficient documentation

## 2015-09-07 DIAGNOSIS — S60221A Contusion of right hand, initial encounter: Secondary | ICD-10-CM | POA: Insufficient documentation

## 2015-09-07 DIAGNOSIS — Y939 Activity, unspecified: Secondary | ICD-10-CM | POA: Insufficient documentation

## 2015-09-07 DIAGNOSIS — W2203XA Walked into furniture, initial encounter: Secondary | ICD-10-CM | POA: Insufficient documentation

## 2015-09-07 MED ORDER — OXYCODONE-ACETAMINOPHEN 5-325 MG PO TABS
1.0000 | ORAL_TABLET | Freq: Once | ORAL | Status: AC
  Administered 2015-09-07: 1 via ORAL
  Filled 2015-09-07: qty 1

## 2015-09-07 MED ORDER — BACITRACIN ZINC 500 UNIT/GM EX OINT
TOPICAL_OINTMENT | Freq: Once | CUTANEOUS | Status: AC
  Administered 2015-09-07: 1 via TOPICAL
  Filled 2015-09-07: qty 0.9

## 2015-09-07 NOTE — ED Provider Notes (Signed)
CSN: [id number]     Arrival date & time 09/07/15  2230 History   First MD Initiated Contact with Patient 09/07/15 2243     Chief Complaint  Patient presents with  . Hand Injury     (Consider location/radiation/quality/duration/timing/severity/associated sxs/prior Treatment) HPI Comments: Patient struck his hand against the wooden support of a cough during an episode of anger.  Patient is a 20 y.o. male presenting with hand injury. The history is provided by the patient. No language interpreter was used.  Hand Injury Location:  Hand Injury: yes   Hand location:  R hand Pain details:    Quality:  Aching and throbbing   Severity:  Moderate   Onset quality:  Sudden   Timing:  Constant Chronicity:  New Foreign body present:  No foreign bodies Tetanus status:  Up to date Prior injury to area:  No   History reviewed. No pertinent past medical history. Past Surgical History  Procedure Laterality Date  . Knee surgery    . Hypospadias surgery     History reviewed. No pertinent family history. Social History  Substance Use Topics  . Smoking status: Never Smoker   . Smokeless tobacco: None  . Alcohol Use: No    Review of Systems  Musculoskeletal: Positive for arthralgias.  All other systems reviewed and are negative.     Allergies  Review of patient's allergies indicates no known allergies.  Home Medications   Prior to Admission medications   Not on File   BP 130/69 mmHg  Pulse 71  Temp(Src) 98.7 F (37.1 C) (Temporal)  Resp 18  Ht 5\' 6"  (1.676 m)  Wt 61.825 kg  BMI 22.01 kg/m2  SpO2 100% Physical Exam  Constitutional: He is oriented to person, place, and time. He appears well-developed and well-nourished.  HENT:  Head: Normocephalic.  Eyes: Conjunctivae are normal.  Neck: Neck supple.  Cardiovascular: Normal rate and regular rhythm.   Pulmonary/Chest: Effort normal and breath sounds normal.  Abdominal: Soft.  Musculoskeletal: He exhibits edema and  tenderness.       Hands: Neurological: He is alert and oriented to person, place, and time.  Skin: Skin is warm and dry.  Psychiatric: He has a normal mood and affect.  Nursing note and vitals reviewed.   ED Course  Procedures (including critical care time) Labs Review Labs Reviewed - No data to display  Imaging Review Dg Hand Complete Right  09/07/2015  CLINICAL DATA:  Punched couch. Right hand pain and swelling mainly in region of fourth and fifth metacarpals. Initial encounter. EXAM: RIGHT HAND - COMPLETE 3+ VIEW COMPARISON:  04/09/2015 FINDINGS: There is no evidence of fracture or dislocation. There is no evidence of arthropathy or other focal bone abnormality. Soft tissues are unremarkable. IMPRESSION: Negative. Electronically Signed   By: Myles RosenthalJohn  Stahl M.D.   On: 09/07/2015 23:39   I have personally reviewed and evaluated these images and lab results as part of my medical decision-making.   EKG Interpretation None     Radiology results reviewed and shared with patient. MDM   Final diagnoses:  None  Patient X-Ray negative for obvious fracture or dislocation.  Hand contusion. Conservative therapy recommended and discussed. Patient will be discharged home & is agreeable with above plan. Returns precautions discussed. Pt appears safe for discharge.      Felicie Mornavid Dabria Wadas, NP 09/08/15 [id number]  Rolland PorterMark James, MD 09/18/15 [id number]

## 2015-09-07 NOTE — ED Notes (Signed)
Pt reports hitting the couch with his right hand and hit a piece of wood. Pt c/o pain and swelling to right hand.

## 2015-09-07 NOTE — Discharge Instructions (Signed)
Hand Contusion  A hand contusion is a deep bruise on your hand area. Contusions are the result of an injury that caused bleeding under the skin. The contusion may turn blue, purple, or yellow. Minor injuries will give you a painless contusion, but more severe contusions may stay painful and swollen for a few weeks.  CAUSES   A contusion is usually caused by a blow, trauma, or direct force to an area of the body.  SYMPTOMS    Swelling and redness of the injured area.   Discoloration of the injured area.   Tenderness and soreness of the injured area.   Pain.  DIAGNOSIS   The diagnosis can be made by taking a history and performing a physical exam. An X-ray, CT scan, or MRI may be needed to determine if there were any associated injuries, such as broken bones (fractures).  TREATMENT   Often, the best treatment for a hand contusion is resting, elevating, icing, and applying cold compresses to the injured area. Over-the-counter medicines may also be recommended for pain control.  HOME CARE INSTRUCTIONS    Put ice on the injured area.    Put ice in a plastic bag.    Place a towel between your skin and the bag.    Leave the ice on for 15-20 minutes, 03-04 times a day.   Only take over-the-counter or prescription medicines as directed by your caregiver. Your caregiver may recommend avoiding anti-inflammatory medicines (aspirin, ibuprofen, and naproxen) for 48 hours because these medicines may increase bruising.   If told, use an elastic wrap as directed. This can help reduce swelling. You may remove the wrap for sleeping, showering, and bathing. If your fingers become numb, cold, or blue, take the wrap off and reapply it more loosely.   Elevate your hand with pillows to reduce swelling.   Avoid overusing your hand if it is painful.  SEEK IMMEDIATE MEDICAL CARE IF:    You have increased redness, swelling, or pain in your hand.   Your swelling or pain is not relieved with medicines.   You have loss of feeling in  your hand or are unable to move your fingers.   Your hand turns cold or blue.   You have pain when you move your fingers.   Your hand becomes warm to the touch.   Your contusion does not improve in 2 days.  MAKE SURE YOU:    Understand these instructions.   Will watch your condition.   Will get help right away if you are not doing well or get worse.     This information is not intended to replace advice given to you by your health care provider. Make sure you discuss any questions you have with your health care provider.     Document Released: 09/13/2001 Document Revised: 12/17/2011 Document Reviewed: 09/15/2011  Elsevier Interactive Patient Education 2016 Elsevier Inc.

## 2015-10-08 ENCOUNTER — Encounter (HOSPITAL_COMMUNITY): Payer: Self-pay | Admitting: Emergency Medicine

## 2015-10-08 ENCOUNTER — Emergency Department (HOSPITAL_COMMUNITY)
Admission: EM | Admit: 2015-10-08 | Discharge: 2015-10-08 | Disposition: A | Discharge status: Home / Self Care | Payer: Medicaid Other | Attending: Emergency Medicine | Admitting: Emergency Medicine

## 2015-10-08 ENCOUNTER — Emergency Department (HOSPITAL_COMMUNITY): Payer: Medicaid Other

## 2015-10-08 DIAGNOSIS — T1490XA Injury, unspecified, initial encounter: Secondary | ICD-10-CM

## 2015-10-08 DIAGNOSIS — Y999 Unspecified external cause status: Secondary | ICD-10-CM | POA: Insufficient documentation

## 2015-10-08 DIAGNOSIS — Y939 Activity, unspecified: Secondary | ICD-10-CM | POA: Insufficient documentation

## 2015-10-08 DIAGNOSIS — M25531 Pain in right wrist: Secondary | ICD-10-CM | POA: Insufficient documentation

## 2015-10-08 DIAGNOSIS — W228XXA Striking against or struck by other objects, initial encounter: Secondary | ICD-10-CM | POA: Insufficient documentation

## 2015-10-08 DIAGNOSIS — Y92003 Bedroom of unspecified non-institutional (private) residence as the place of occurrence of the external cause: Secondary | ICD-10-CM | POA: Insufficient documentation

## 2015-10-08 DIAGNOSIS — M79641 Pain in right hand: Secondary | ICD-10-CM | POA: Insufficient documentation

## 2015-10-08 DIAGNOSIS — Y29XXXA Contact with blunt object, undetermined intent, initial encounter: Secondary | ICD-10-CM | POA: Insufficient documentation

## 2015-10-08 NOTE — ED Provider Notes (Signed)
CSN: [id number]     Arrival date & time 10/08/15  1904 History  By signing my name below, I, Bridgette HabermannMaria Tan, attest that this documentation has been prepared under the direction and in the presence of Mattie MarlinJessica Lonie Newsham, PA-C. Electronically Signed: Bridgette HabermannMaria Tan, ED Scribe. 10/08/2015. 8:04 PM.    Chief Complaint  Patient presents with  . Hand Injury   The history is provided by the patient. No language interpreter was used.    HPI Comments: Eddie Perry is a 20 y.o. male who presents to the Emergency Department complaining of a throbbing, burning non-radiating right hand pain s/p mechanical injury around 3pm one day ago. Pt "got mad" and hit his hand on a piece of wood hanging by his bedroom door. Pt is able to move his right hand and wrist but states it exacerbates the pain. Pt took a Percocet at home to help him sleep and that gave him moderate relief. Pt is currently icing it on exam but has not tried anything else to alleviate the pain at home. Pt has no stomach issues or ulcers. Pt denies any additional injuries nor has any additional complaints or symptoms.   History reviewed. No pertinent past medical history. Past Surgical History  Procedure Laterality Date  . Knee surgery    . Hypospadias surgery     History reviewed. No pertinent family history. Social History  Substance Use Topics  . Smoking status: Never Smoker   . Smokeless tobacco: None  . Alcohol Use: No    Review of Systems  Constitutional: Negative for fever.  Eyes: Negative for visual disturbance.  Gastrointestinal: Negative for nausea and vomiting.  Musculoskeletal: Positive for joint swelling.  Skin: Positive for wound.  Neurological: Negative for syncope.     Allergies  Review of patient's allergies indicates no known allergies.  Home Medications   Prior to Admission medications   Not on File   BP 135/89 mmHg  Pulse 88  Temp(Src) 97.8 F (36.6 C) (Oral)  Resp 20  Ht 5\' 6"  (1.676 m)  Wt 134 lb 12.8 oz  (61.145 kg)  BMI 21.77 kg/m2  SpO2 100% Physical Exam  Constitutional: He appears well-developed and well-nourished. No distress.  HENT:  Head: Normocephalic and atraumatic.  Eyes: Conjunctivae are normal.  Pulmonary/Chest: Effort normal.  Musculoskeletal: Normal range of motion.  Examination of right hand and wrist revealed edema, no ecchymosis or erythema, full AROM of bilateral upper extremities including the right hand and wrist, grip strength 5/5, quick capillary refill, sensation intact, small abrasion noted to MCP of third finger, patient is neurovascularly intact distally.  Neurological: He is alert. Coordination normal.  Psychiatric: He has a normal mood and affect. His behavior is normal.  Nursing note and vitals reviewed.   ED Course  Procedures  DIAGNOSTIC STUDIES: Oxygen Saturation is 100% on RA, normal by my interpretation.    COORDINATION OF CARE: 8:03 PM Discussed treatment plan with pt at bedside which includes Tylenol, Advil, ice, and follow-up and pt agreed to plan.  Imaging Review Dg Wrist Complete Right  10/08/2015  CLINICAL DATA:  Punched a piece of wood yesterday.  Wrist pain. EXAM: RIGHT WRIST - COMPLETE 3+ VIEW COMPARISON:  Multiple old exams of the hand. FINDINGS: There is no evidence of fracture or dislocation. There is no evidence of arthropathy or other focal bone abnormality. Soft tissues are unremarkable. IMPRESSION: Negative. Electronically Signed   By: Paulina FusiMark  Shogry M.D.   On: 10/08/2015 19:32   Dg Hand Complete  Right  10/08/2015  CLINICAL DATA:  Punched diffuse it would yesterday. Pain in the wrist and carpus. EXAM: RIGHT HAND - COMPLETE 3+ VIEW COMPARISON:  09/07/2015.  04/09/2015. FINDINGS: There is no evidence of fracture or dislocation. There is no evidence of arthropathy or other focal bone abnormality. Soft tissues are unremarkable. Old healed minimal injury of the midportion of the fifth metacarpal. IMPRESSION: Negative. Electronically Signed   By:  Paulina FusiMark  Shogry M.D.   On: 10/08/2015 19:31   I have personally reviewed and evaluated these images as part of my medical decision-making.   MDM   Final diagnoses:  Hand pain, right  Right wrist pain    Patient with right hand pain and swelling after punching a piece of wood. X-rays revealed no acute abnormalities. Patient is neurovascularly intact distally and with full ROM. Patient will be discharged with symptomatic treatment. Discussed RICE. Instructed patient to follow-up with a primary care provider or the health department if symptoms do not improve. Discussed strict return precautions. Patient expressed understanding to the discharge instructions.  I personally performed the services described in this documentation, which was scribed in my presence. The recorded information has been reviewed and is accurate.      Jerre SimonJessica L Kiari Hosmer, PA 10/08/15 2101  Bethann BerkshireJoseph Zammit, MD 10/08/15 2248

## 2015-10-08 NOTE — ED Notes (Signed)
Pt "got mad" and hit his hand on a piece of wood. Pt presents with a small abrasion to R hand just above middle finger and swelling and tenderness to R hand and wrist area. Pt able to move hand and wrist but states that it "hurts bad".

## 2015-10-08 NOTE — Discharge Instructions (Signed)
Use ice on your hand and wrist 20 minutes on and 20 minutes off for the next 2 days as much as you can. Be sure to put a thin towel between your skin and the ice. Take Advil as needed for pain. Be sure to eat when taking this medication. Follow up with the health department or here in the ED if symptoms do not improve.  Return to emergency department if you experience increased swelling, redness, warmth, loose sensation in your fingers, you are unable to grip items, you have fevers or chills  Musculoskeletal Pain Musculoskeletal pain is muscle and boney aches and pains. These pains can occur in any part of the body. Your caregiver may treat you without knowing the cause of the pain. They may treat you if blood or urine tests, X-rays, and other tests were normal.  CAUSES There is often not a definite cause or reason for these pains. These pains may be caused by a type of germ (virus). The discomfort may also come from overuse. Overuse includes working out too hard when your body is not fit. Boney aches also come from weather changes. Bone is sensitive to atmospheric pressure changes. HOME CARE INSTRUCTIONS   Ask when your test results will be ready. Make sure you get your test results.  Only take over-the-counter or prescription medicines for pain, discomfort, or fever as directed by your caregiver. If you were given medications for your condition, do not drive, operate machinery or power tools, or sign legal documents for 24 hours. Do not drink alcohol. Do not take sleeping pills or other medications that may interfere with treatment.  Continue all activities unless the activities cause more pain. When the pain lessens, slowly resume normal activities. Gradually increase the intensity and duration of the activities or exercise.  During periods of severe pain, bed rest may be helpful. Lay or sit in any position that is comfortable.  Putting ice on the injured area.  Put ice in a bag.  Place a  towel between your skin and the bag.  Leave the ice on for 15 to 20 minutes, 3 to 4 times a day.  Follow up with your caregiver for continued problems and no reason can be found for the pain. If the pain becomes worse or does not go away, it may be necessary to repeat tests or do additional testing. Your caregiver may need to look further for a possible cause. SEEK IMMEDIATE MEDICAL CARE IF:  You have pain that is getting worse and is not relieved by medications.  You develop chest pain that is associated with shortness or breath, sweating, feeling sick to your stomach (nauseous), or throw up (vomit).  Your pain becomes localized to the abdomen.  You develop any new symptoms that seem different or that concern you. MAKE SURE YOU:   Understand these instructions.  Will watch your condition.  Will get help right away if you are not doing well or get worse.   This information is not intended to replace advice given to you by your health care provider. Make sure you discuss any questions you have with your health care provider.   Document Released: 03/24/2005 Document Revised: 06/16/2011 Document Reviewed: 11/26/2012 Elsevier Interactive Patient Education 2016 Elsevier Inc.  Cryotherapy Cryotherapy means treatment with cold. Ice or gel packs can be used to reduce both pain and swelling. Ice is the most helpful within the first 24 to 48 hours after an injury or flare-up from overusing a muscle or  joint. Sprains, strains, spasms, burning pain, shooting pain, and aches can all be eased with ice. Ice can also be used when recovering from surgery. Ice is effective, has very few side effects, and is safe for most people to use. PRECAUTIONS  Ice is not a safe treatment option for people with:  Raynaud phenomenon. This is a condition affecting small blood vessels in the extremities. Exposure to cold may cause your problems to return.  Cold hypersensitivity. There are many forms of cold  hypersensitivity, including:  Cold urticaria. Red, itchy hives appear on the skin when the tissues begin to warm after being iced.  Cold erythema. This is a red, itchy rash caused by exposure to cold.  Cold hemoglobinuria. Red blood cells break down when the tissues begin to warm after being iced. The hemoglobin that carry oxygen are passed into the urine because they cannot combine with blood proteins fast enough.  Numbness or altered sensitivity in the area being iced. If you have any of the following conditions, do not use ice until you have discussed cryotherapy with your caregiver:  Heart conditions, such as arrhythmia, angina, or chronic heart disease.  High blood pressure.  Healing wounds or open skin in the area being iced.  Current infections.  Rheumatoid arthritis.  Poor circulation.  Diabetes. Ice slows the blood flow in the region it is applied. This is beneficial when trying to stop inflamed tissues from spreading irritating chemicals to surrounding tissues. However, if you expose your skin to cold temperatures for too long or without the proper protection, you can damage your skin or nerves. Watch for signs of skin damage due to cold. HOME CARE INSTRUCTIONS Follow these tips to use ice and cold packs safely.  Place a dry or damp towel between the ice and skin. A damp towel will cool the skin more quickly, so you may need to shorten the time that the ice is used.  For a more rapid response, add gentle compression to the ice.  Ice for no more than 10 to 20 minutes at a time. The bonier the area you are icing, the less time it will take to get the benefits of ice.  Check your skin after 5 minutes to make sure there are no signs of a poor response to cold or skin damage.  Rest 20 minutes or more between uses.  Once your skin is numb, you can end your treatment. You can test numbness by very lightly touching your skin. The touch should be so light that you do not see  the skin dimple from the pressure of your fingertip. When using ice, most people will feel these normal sensations in this order: cold, burning, aching, and numbness.  Do not use ice on someone who cannot communicate their responses to pain, such as small children or people with dementia. HOW TO MAKE AN ICE PACK Ice packs are the most common way to use ice therapy. Other methods include ice massage, ice baths, and cryosprays. Muscle creams that cause a cold, tingly feeling do not offer the same benefits that ice offers and should not be used as a substitute unless recommended by your caregiver. To make an ice pack, do one of the following:  Place crushed ice or a bag of frozen vegetables in a sealable plastic bag. Squeeze out the excess air. Place this bag inside another plastic bag. Slide the bag into a pillowcase or place a damp towel between your skin and the bag.  Mix  3 parts water with 1 part rubbing alcohol. Freeze the mixture in a sealable plastic bag. When you remove the mixture from the freezer, it will be slushy. Squeeze out the excess air. Place this bag inside another plastic bag. Slide the bag into a pillowcase or place a damp towel between your skin and the bag. SEEK MEDICAL CARE IF:  You develop white spots on your skin. This may give the skin a blotchy (mottled) appearance.  Your skin turns blue or pale.  Your skin becomes waxy or hard.  Your swelling gets worse. MAKE SURE YOU:   Understand these instructions.  Will watch your condition.  Will get help right away if you are not doing well or get worse.   This information is not intended to replace advice given to you by your health care provider. Make sure you discuss any questions you have with your health care provider.   Document Released: 11/18/2010 Document Revised: 04/14/2014 Document Reviewed: 11/18/2010 Elsevier Interactive Patient Education [employer]2016 Elsevier Inc.

## 2016-05-10 ENCOUNTER — Encounter (HOSPITAL_COMMUNITY): Payer: Self-pay | Admitting: *Deleted

## 2016-05-10 ENCOUNTER — Emergency Department (HOSPITAL_COMMUNITY)
Admission: EM | Admit: 2016-05-10 | Discharge: 2016-05-11 | Disposition: A | Discharge status: Home / Self Care | Payer: Self-pay | Attending: Emergency Medicine | Admitting: Emergency Medicine

## 2016-05-10 DIAGNOSIS — F1910 Other psychoactive substance abuse, uncomplicated: Secondary | ICD-10-CM | POA: Diagnosis present

## 2016-05-10 DIAGNOSIS — F17210 Nicotine dependence, cigarettes, uncomplicated: Secondary | ICD-10-CM | POA: Insufficient documentation

## 2016-05-10 DIAGNOSIS — Z5181 Encounter for therapeutic drug level monitoring: Secondary | ICD-10-CM | POA: Insufficient documentation

## 2016-05-10 DIAGNOSIS — R45851 Suicidal ideations: Secondary | ICD-10-CM

## 2016-05-10 DIAGNOSIS — R440 Auditory hallucinations: Secondary | ICD-10-CM

## 2016-05-10 DIAGNOSIS — F10121 Alcohol abuse with intoxication delirium: Secondary | ICD-10-CM | POA: Diagnosis present

## 2016-05-10 LAB — CBC WITH DIFFERENTIAL/PLATELET
Basophils Absolute: 0 10*3/uL (ref 0.0–0.1)
Basophils Relative: 1 %
EOS PCT: 1 %
Eosinophils Absolute: 0.1 10*3/uL (ref 0.0–0.7)
HCT: 47.9 % (ref 39.0–52.0)
HEMOGLOBIN: 16.7 g/dL (ref 13.0–17.0)
LYMPHS ABS: 2.5 10*3/uL (ref 0.7–4.0)
LYMPHS PCT: 31 %
MCH: 29.6 pg (ref 26.0–34.0)
MCHC: 34.9 g/dL (ref 30.0–36.0)
MCV: 84.8 fL (ref 78.0–100.0)
Monocytes Absolute: 0.5 10*3/uL (ref 0.1–1.0)
Monocytes Relative: 7 %
Neutro Abs: 4.9 10*3/uL (ref 1.7–7.7)
Neutrophils Relative %: 60 %
Platelets: 275 10*3/uL (ref 150–400)
RBC: 5.65 MIL/uL (ref 4.22–5.81)
RDW: 14.1 % (ref 11.5–15.5)
WBC: 8 10*3/uL (ref 4.0–10.5)

## 2016-05-10 LAB — RAPID URINE DRUG SCREEN, HOSP PERFORMED
Amphetamines: NOT DETECTED
BENZODIAZEPINES: POSITIVE — AB
Barbiturates: NOT DETECTED
Cocaine: NOT DETECTED
OPIATES: POSITIVE — AB
Tetrahydrocannabinol: POSITIVE — AB

## 2016-05-10 LAB — BASIC METABOLIC PANEL
Anion gap: 9 (ref 5–15)
BUN: 9 mg/dL (ref 6–20)
CO2: 30 mmol/L (ref 22–32)
Calcium: 9.4 mg/dL (ref 8.9–10.3)
Chloride: 104 mmol/L (ref 101–111)
Creatinine, Ser: 1.02 mg/dL (ref 0.61–1.24)
GFR calc non Af Amer: >60 mL/min (ref >60)
Glucose, Bld: 111 mg/dL — ABNORMAL HIGH (ref 65–99)
POTASSIUM: 4.1 mmol/L (ref 3.5–5.1)
Sodium: 143 mmol/L (ref 135–145)

## 2016-05-10 LAB — ETHANOL: Alcohol, Ethyl (B): 188 mg/dL — ABNORMAL HIGH (ref <5)

## 2016-05-10 MED ORDER — ACETAMINOPHEN 325 MG PO TABS: 650.0000 mg | ORAL_TABLET | ORAL | Status: DC | PRN

## 2016-05-10 MED ORDER — LORAZEPAM 1 MG PO TABS: 1.0000 mg | ORAL_TABLET | Freq: Three times a day (TID) | ORAL | Status: DC | PRN

## 2016-05-10 MED ORDER — ALUM & MAG HYDROXIDE-SIMETH 200-200-20 MG/5ML PO SUSP: 30.0000 mL | ORAL | Status: DC | PRN

## 2016-05-10 MED ORDER — NICOTINE 21 MG/24HR TD PT24: 21.0000 mg | MEDICATED_PATCH | Freq: Every day | TRANSDERMAL | Status: DC | PRN

## 2016-05-10 NOTE — ED Notes (Signed)
Dads phone # [phone]

## 2016-05-10 NOTE — ED Notes (Signed)
[facility] called pt to be reevaluated in the morning.

## 2016-05-10 NOTE — ED Notes (Signed)
[place] officer advises if they are needed for anything to just call. Officer left pt w/ the ED.

## 2016-05-10 NOTE — ED Notes (Signed)
Pt allowed to use phone at this time  

## 2016-05-10 NOTE — ED Notes (Signed)
2 bags of belongings placed in locker.

## 2016-05-10 NOTE — BH Assessment (Addendum)
Tele Assessment Note   Eddie Perry is an 21 y.o. male who presents to the ED under IVC initiated by [facility]. Pt was reportedly brought in by [facility] after being questioned by police for walking in the street and telling police officers that he "didn't care if he died." Pt reports he was angry when he made this statement and states he is not suicidal and has no intent to harm himself. Pt denies HI and current AVH. Pt reports a H/O hearing voices about a year ago. Pt states he lives with his parents and is currently engaged. 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 Pt describes his overall mood as "pretty good" and reports no significant stressors. Pt appears anxious during assessment asking "when am I going to be able to leave? I hope I get to go home so I can eat."   Per Nira Conn, NP pt will need an AM psych eval based on IVC and recommendation from EDP. Corine Shelter, RN contacted and notified of the disposition.  Diagnosis: Alcohol Use D/O; Cannabis Use D/O; Opioid Use D/O   Past Medical History: History reviewed. No pertinent past medical history.  Past Surgical History:  Procedure Laterality Date   hypospadias surgery     KNEE SURGERY      Family History: History reviewed. No pertinent family history.  Social History:  reports that he has been smoking Cigarettes.  He has never used smokeless tobacco. He reports that he drinks about 1.8 oz of alcohol per week . He reports that he does not use drugs.  Additional Social History:  Alcohol / Drug Use Pain Medications: See PTA meds  Prescriptions: See PTA meds  Over the Counter: See PTA meds  History of alcohol / drug use?: Yes Longest period of sobriety (when/how long): unknown Substance #1 Name of Substance 1: Alcohol 1 - Age of First Use: 20 1 - Amount (size/oz): 4 shots 1 -  Frequency: pt stated "not often" 1 - Duration: ongoing  1 - Last Use / Amount: today Substance #2 Name of Substance 2: Marijuana 2 - Age of First Use: pt unsure  2 - Amount (size/oz): 1 blunt  2 - Frequency: pt stated "not often" 2 - Duration: ongoing 2 - Last Use / Amount: pt stated "couple days ago", UDS +  CIWA: CIWA-Ar BP: 116/72 Pulse Rate: 89 COWS:    PATIENT STRENGTHS: (choose at least two) Average or above average intelligence Communication skills Supportive family/friends Work skills  Allergies: No Known Allergies  Home Medications:  (Not in a hospital admission)  OB/GYN Status:  No LMP for male patient.  General Assessment Data Location of Assessment: [facility] ED TTS Assessment: In system Is this a Tele or Face-to-Face Assessment?: Tele Assessment Is this an Initial Assessment or a Re-assessment for this encounter?: Initial Assessment Marital status: Long term relationship Is patient pregnant?: No Pregnancy Status: No Living Arrangements: Parent Can pt return to current living arrangement?: Yes Admission Status: Involuntary Is patient capable of signing voluntary admission?: No Referral Source: Other Emergency planning/management officer) Insurance type: none     Crisis Care Plan Living Arrangements: Parent Name of Psychiatrist: none Name of Therapist: none  Education Status Is patient currently in school?: No Highest grade of school patient has completed: 12th Name of school: home-schooled   Risk to  self with the past 6 months Suicidal Ideation: No Has patient been a risk to self within the past 6 months prior to admission? : No Suicidal Intent: No Has patient had any suicidal intent within the past 6 months prior to admission? : No Is patient at risk for suicide?: No Suicidal Plan?: No Has patient had any suicidal plan within the past 6 months prior to admission? : No Access to Means: No What has been your use of drugs/alcohol within the last 12 months?: reports to drinking  alcohol and using marijuana  Previous Attempts/Gestures: No Triggers for Past Attempts: None known Intentional Self Injurious Behavior: None Family Suicide History: No Recent stressful life event(s): Other (Comment) (no stressors reported ) Persecutory voices/beliefs?: No Depression: No Substance abuse history and/or treatment for substance abuse?: No Suicide prevention information given to non-admitted patients: Not applicable  Risk to Others within the past 6 months Homicidal Ideation: No Does patient have any lifetime risk of violence toward others beyond the six months prior to admission? : No Thoughts of Harm to Others: No Current Homicidal Intent: No Current Homicidal Plan: No Access to Homicidal Means: No History of harm to others?: No Assessment of Violence: None Noted Does patient have access to weapons?: No Criminal Charges Pending?: No Does patient have a court date: Yes Court Date: 06/19/16 Is patient on probation?: No  Psychosis Hallucinations: Auditory (in past year ) Delusions: None noted  Mental Status Report Appearance/Hygiene: Disheveled Eye Contact: Fair Motor Activity: Freedom of movement Speech: Logical/coherent, Soft Level of Consciousness: Drowsy Mood: Anxious Affect: Flat Anxiety Level: Minimal Thought Processes: Coherent, Relevant Judgement: Impaired Orientation: Person, Place, Time Obsessive Compulsive Thoughts/Behaviors: None  Cognitive Functioning Concentration: Normal Memory: Recent Intact, Remote Impaired IQ: Average Insight: Poor Impulse Control: Fair Appetite: Good Sleep: Increased Total Hours of Sleep: 12 Vegetative Symptoms: None  ADLScreening [facility] Assessment [facility]) Patient's cognitive ability adequate to safely complete daily activities?: Yes Patient able to express need for assistance with ADLs?: Yes Independently performs ADLs?: Yes (appropriate for developmental age)  Prior Inpatient Therapy Prior Inpatient Therapy:  No  Prior Outpatient Therapy Prior Outpatient Therapy: No Does patient have an ACCT team?: No Does patient have Intensive In-House Services?  : No Does patient have [facility] services? : No Does patient have P4CC services?: No  ADL Screening (condition at time of admission) Patient's cognitive ability adequate to safely complete daily activities?: Yes Is the patient deaf or have difficulty hearing?: No Does the patient have difficulty seeing, even when wearing glasses/contacts?: No Does the patient have difficulty concentrating, remembering, or making decisions?: No Patient able to express need for assistance with ADLs?: Yes Does the patient have difficulty dressing or bathing?: No Independently performs ADLs?: Yes (appropriate for developmental age) Does the patient have difficulty walking or climbing stairs?: No Weakness of Legs: None Weakness of Arms/Hands: None  Home Assistive Devices/Equipment Home Assistive Devices/Equipment: None    Abuse/Neglect Assessment (Assessment to be complete while patient is alone) Physical Abuse: Denies Verbal Abuse: Denies Sexual Abuse: Denies Exploitation of patient/patient's resources: Denies Self-Neglect: Denies     Merchant navy officerAdvance Directives (For Healthcare) Does Patient Have a Medical Advance Directive?: No Would patient like information on creating a medical advance directive?: No - Patient declined    Additional Information 1:1 In Past 12 Months?: No CIRT Risk: No Elopement Risk: No Does patient have medical clearance?: Yes     Disposition:  Disposition Initial Assessment Completed for this Encounter: Yes Disposition of Patient: Other dispositions Other disposition(s):  Other (Comment) (AM psych eval per Nira Conn, NP )  Karolee Ohs 05/10/2016 11:24 PM

## 2016-05-10 NOTE — ED Triage Notes (Signed)
Pt here with [place] police. Officer states that he happened to come upon the pt the middle of the road. The officer asked the pt if he had any needles on him and at that time, pt became angry with this question and he told the officer, "I don't care if I die." Pt has had 2 swallows of liquor today. Pt denies hi/si and that he said "I don't care if I die because he was angry."

## 2016-05-10 NOTE — ED Provider Notes (Signed)
[facility] DEPT Provider Note   CSN: [id number] Arrival date & time: 05/10/16  1910     History   Chief Complaint Chief Complaint  Patient presents with  . V70.1    HPI Eddie Perry is a 21 y.o. male.  The history is provided by the patient and the police. The history is limited by the condition of the patient (intoxicated).    Pt was seen at 1955. Per Police and pt;  Pt states he "might have had a little too much to drink tonight." Pt states he was walking across a street when the Police stopped him. Police told pt he was walking in the middle of the street. Pt told Police he "didn't care if he died." Pt states he said this "because I was angry." Pt states he "gets like that, mad one minute and fine the next." Has not been evaluated by Psych MD previously. Currently denies SI/SA, no HI, no hallucinations.   History reviewed. No pertinent past medical history.  There are no active problems to display for this patient.   Past Surgical History:  Procedure Laterality Date  . hypospadias surgery    . KNEE SURGERY         Home Medications    Prior to Admission medications   Not on File    Family History History reviewed. No pertinent family history.  Social History Social History  Substance Use Topics  . Smoking status: Current Some Day Smoker    Types: Cigarettes  . Smokeless tobacco: Never Used  . Alcohol use 1.8 oz/week    3 Shots of liquor per week     Allergies   Patient has no known allergies.   Review of Systems Review of Systems  Unable to perform ROS: Other     Physical Exam Updated Vital Signs BP 116/72 (BP Location: Left Arm)   Pulse 89   Temp 97.5 F (36.4 C) (Oral)   Resp 20   Ht 5\' 6"  (1.676 m)   Wt 147 lb (66.7 kg)   SpO2 100%   BMI 23.73 kg/m   Physical Exam 2000: Physical examination:  Nursing notes reviewed; Vital signs and O2 SAT reviewed;  Constitutional: Well developed, Well nourished, Well hydrated, In no acute  distress; Head:  Normocephalic, atraumatic; Eyes: EOMI, PERRL, No scleral icterus; ENMT: Mouth and pharynx normal, Mucous membranes moist; Neck: Supple, Full range of motion; Cardiovascular: Regular rate and rhythm; Respiratory: Breath sounds clear, No wheezes.  Speaking full sentences with ease, Normal respiratory effort/excursion; Chest: No deformity, Movement normal; Abdomen: Nondistended; Extremities: No deformity.; Neuro: AA&Ox3, Major CN grossly intact.  Speech slightly slurred. No gross focal motor deficits in extremities. Climbs on and off stretcher easily by himself. Gait steady.; Skin: Color normal, Warm, Dry.; Psych:  Affect flat.    ED Treatments / Results  Labs (all labs ordered are listed, but only abnormal results are displayed)   EKG  EKG Interpretation None       Radiology   Procedures Procedures (including critical care time)  Medications Ordered in ED Medications - No data to display   Initial Impression / Assessment and Plan / ED Course  I have reviewed the triage vital signs and the nursing notes.  Pertinent labs & imaging results that were available during my care of the patient were reviewed by me and considered in my medical decision making (see chart for details).  MDM Reviewed: previous chart, nursing note and vitals Reviewed previous: labs Interpretation: labs  Results for orders placed or performed during the hospital encounter of 05/10/16  Urine rapid drug screen (hosp performed)  Result Value Ref Range   Opiates POSITIVE (A) NONE DETECTED   Cocaine NONE DETECTED NONE DETECTED   Benzodiazepines POSITIVE (A) NONE DETECTED   Amphetamines NONE DETECTED NONE DETECTED   Tetrahydrocannabinol POSITIVE (A) NONE DETECTED   Barbiturates NONE DETECTED NONE DETECTED  Basic metabolic panel  Result Value Ref Range   Sodium 143 135 - 145 mmol/L   Potassium 4.1 3.5 - 5.1 mmol/L   Chloride 104 101 - 111 mmol/L   CO2 30 22 - 32 mmol/L   Glucose, Bld 111  (H) 65 - 99 mg/dL   BUN 9 6 - 20 mg/dL   Creatinine, Ser 1.611.02 0.61 - 1.24 mg/dL   Calcium 9.4 8.9 - 09.610.3 mg/dL   GFR calc non Af Amer >60 >60 mL/min   GFR calc Af Amer >60 >60 mL/min   Anion gap 9 5 - 15  Ethanol  Result Value Ref Range   Alcohol, Ethyl (B) 188 (H) <5 mg/dL  CBC with Differential  Result Value Ref Range   WBC 8.0 4.0 - 10.5 K/uL   RBC 5.65 4.22 - 5.81 MIL/uL   Hemoglobin 16.7 13.0 - 17.0 g/dL   HCT 04.547.9 40.939.0 - 81.152.0 %   MCV 84.8 78.0 - 100.0 fL   MCH 29.6 26.0 - 34.0 pg   MCHC 34.9 30.0 - 36.0 g/dL   RDW 91.414.1 78.211.5 - 95.615.5 %   Platelets 275 150 - 400 K/uL   Neutrophils Relative % 60 %   Neutro Abs 4.9 1.7 - 7.7 K/uL   Lymphocytes Relative 31 %   Lymphs Abs 2.5 0.7 - 4.0 K/uL   Monocytes Relative 7 %   Monocytes Absolute 0.5 0.1 - 1.0 K/uL   Eosinophils Relative 1 %   Eosinophils Absolute 0.1 0.0 - 0.7 K/uL   Basophils Relative 1 %   Basophils Absolute 0.0 0.0 - 0.1 K/uL     2230:  Pt making statements he wanted to leave the ED before psych eval; IVC paperwork completed. Pt's family told ED RN that pt made a comment to them that he has been "hearing voices."  Will have TTS evaluate. Holding orders written.    Final Clinical Impressions(s) / ED Diagnoses   Final diagnoses:  None    New Prescriptions New Prescriptions   No medications on file      Samuel JesterKathleen Harlem Bula, DO 05/10/16 2235

## 2016-05-10 NOTE — ED Notes (Signed)
Pt upset & crying, saying he does not want to leave his family. Family made comment that  pt has been hearing voices also.

## 2016-05-10 NOTE — ED Notes (Signed)
Pt states he has had a couple shoots of liqure tonight. Was walking up the street per police officer & made a comment that he did not care if he died. Pt denies wanting to harm self just had a lot going on.

## 2016-05-11 DIAGNOSIS — R45851 Suicidal ideations: Secondary | ICD-10-CM

## 2016-05-11 DIAGNOSIS — F10921 Alcohol use, unspecified with intoxication delirium: Secondary | ICD-10-CM

## 2016-05-11 DIAGNOSIS — Z79899 Other long term (current) drug therapy: Secondary | ICD-10-CM

## 2016-05-11 DIAGNOSIS — F10121 Alcohol abuse with intoxication delirium: Secondary | ICD-10-CM | POA: Diagnosis present

## 2016-05-11 DIAGNOSIS — F17210 Nicotine dependence, cigarettes, uncomplicated: Secondary | ICD-10-CM

## 2016-05-11 DIAGNOSIS — F1910 Other psychoactive substance abuse, uncomplicated: Secondary | ICD-10-CM

## 2016-05-11 NOTE — Consult Note (Signed)
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 Polysubstance abuse [F19.10]   . Suicidal ideation [R45.851]     Total Time spent with patient: 30 minutes  Subjective:   Eddie Perry is a 21 y.o. male patient admitted with suicidal ideation and intoxication.  HPI:  Per tele assessment note on chart written by Lind Covert, [facility] Counselor:   Caleb Popp is an 21 y.o. male who presents to the ED under IVC initiated by [facility]. Pt was reportedly brought in by Eddie Perry after being questioned by police for walking in the street and telling police officers that he "didn't care if he died." Pt reports he was angry when he made this statement and states he is not suicidal and has no intent to harm himself. Pt denies HI and current AVH. Pt reports a H/O hearing voices about a year ago. Pt states he lives with his parents and is currently engaged. 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 Pt describes his overall mood as "pretty good" and reports no significant stressors. Pt appears anxious during assessment asking "when am I going to be able to leave? I hope I get to go home so I can eat."  Today during tele psych consult:  Pt seen and chart reviewed. Pt was calm and cooperative, alert & oriented x 4, remorseful for his actions, dressed in paper scrubs and sitting on the Perry stretcher. Pt denies suicidal/homicidal ideation, denies auditory/visual hallucinations and does not appear to be responding to internal stimuli.   Pt states "I drank too much yesterday. I don't normally drink  but I started drinking yesterday and did not want to stop. The police brought me here because they said I was walking down the middle of the street but I was crossing the street from the restaurant to call my Daddy to come and get me because I was drunk." BAL 188 and UDS + for THC, benzos, and opiates. Pt stated it was three or four days ago when he used these substances. Pt stated he wants to go home with his family. This Probation officer discussed with Pt the hazards of drinking to excess and buying drugs on the street. Pt verbalized understanding.   Discussed case with Dr Dwyane Dee who recommends to discharge patient home with outpatient resources for treatment of substance abuse and/or therapy.   Past Psychiatric History: None   Risk to Self: Suicidal Ideation: No Suicidal Intent: No Is patient at risk for suicide?: No Suicidal Plan?: No Access to Means: No What has been your use of drugs/alcohol within the last 12 months?: reports to drinking alcohol and using marijuana  Triggers for Past Attempts: None known Intentional Self Injurious Behavior: None Risk to Others: Homicidal Ideation: No Thoughts of Harm to Others: No Current Homicidal Intent: No Current Homicidal Plan: No Access to Homicidal Means: No History of harm to others?: No Assessment of Violence: None Noted Does patient have access to weapons?: No Criminal Charges Pending?: No Does patient have a court date: Yes Court Date: 06/19/16 Prior Inpatient Therapy: Prior Inpatient Therapy: No  Prior Outpatient Therapy: Prior Outpatient Therapy: No Does patient have an ACCT team?: No Does patient have Intensive In-House Services?  : No Does patient have [facility] services? : No Does patient have P4CC services?: No  Past Medical History: History reviewed. No pertinent past medical history.  Past Surgical History:  Procedure Laterality Date  . hypospadias surgery    . KNEE SURGERY     Family History: History reviewed. No pertinent family  history. Family Psychiatric  History: Unknown Social History:  History  Alcohol Use  . 1.8 oz/week  . 3 Shots of liquor per week     History  Drug Use No    Social History   Social History  . Marital status: Single    Spouse name: N/A  . Number of children: N/A  . Years of education: N/A   Social History Main Topics  . Smoking status: Current Some Day Smoker    Types: Cigarettes  . Smokeless tobacco: Never Used  . Alcohol use 1.8 oz/week    3 Shots of liquor per week  . Drug use: No  . Sexual activity: Not Asked   Other Topics Concern  . None   Social History Narrative  . None   Additional Social History:    Allergies:  No Known Allergies  Labs:  Results for orders placed or performed during the Perry encounter of 05/10/16 (from the past 48 hour(s))  Urine rapid drug screen (hosp performed)     Status: Abnormal   Collection Time: 05/10/16  8:30 PM  Result Value Ref Range   Opiates POSITIVE (A) NONE DETECTED   Cocaine NONE DETECTED NONE DETECTED   Benzodiazepines POSITIVE (A) NONE DETECTED   Amphetamines NONE DETECTED NONE DETECTED   Tetrahydrocannabinol POSITIVE (A) NONE DETECTED   Barbiturates NONE DETECTED NONE DETECTED    Comment:        DRUG SCREEN FOR MEDICAL PURPOSES ONLY.  IF CONFIRMATION IS NEEDED FOR ANY PURPOSE, NOTIFY LAB WITHIN 5 DAYS.        LOWEST DETECTABLE LIMITS FOR URINE DRUG SCREEN Drug Class       Cutoff (ng/mL) Amphetamine      1000 Barbiturate      200 Benzodiazepine   765 Tricyclics       465 Opiates          300 Cocaine          300 THC              50   Basic metabolic panel     Status: Abnormal   Collection Time: 05/10/16  8:33 PM  Result Value Ref Range   Sodium 143 135 - 145 mmol/L   Potassium 4.1 3.5 - 5.1 mmol/L   Chloride 104 101 - 111 mmol/L   CO2 30 22 - 32 mmol/L   Glucose, Bld 111 (H) 65 - 99 mg/dL   BUN 9 6 - 20 mg/dL   Creatinine, Ser 1.02 0.61 - 1.24 mg/dL   Calcium 9.4 8.9 - 10.3 mg/dL   GFR calc  non Af Amer >60 >60 mL/min   GFR calc Af Amer >60 >60 mL/min    Comment: (NOTE) The eGFR has been calculated using the CKD EPI equation. This calculation has not been validated in all clinical situations. eGFR's persistently <60 mL/min signify possible Chronic Kidney Disease.    Anion gap 9 5 - 15  Ethanol     Status: Abnormal   Collection Time: 05/10/16  8:33 PM  Result  Value Ref Range   Alcohol, Ethyl (B) 188 (H) <5 mg/dL    Comment:        LOWEST DETECTABLE LIMIT FOR SERUM ALCOHOL IS 5 mg/dL FOR MEDICAL PURPOSES ONLY   CBC with Differential     Status: None   Collection Time: 05/10/16  8:33 PM  Result Value Ref Range   WBC 8.0 4.0 - 10.5 K/uL   RBC 5.65 4.22 - 5.81 MIL/uL   Hemoglobin 16.7 13.0 - 17.0 g/dL   HCT 47.9 39.0 - 52.0 %   MCV 84.8 78.0 - 100.0 fL   MCH 29.6 26.0 - 34.0 pg   MCHC 34.9 30.0 - 36.0 g/dL   RDW 14.1 11.5 - 15.5 %   Platelets 275 150 - 400 K/uL   Neutrophils Relative % 60 %   Neutro Abs 4.9 1.7 - 7.7 K/uL   Lymphocytes Relative 31 %   Lymphs Abs 2.5 0.7 - 4.0 K/uL   Monocytes Relative 7 %   Monocytes Absolute 0.5 0.1 - 1.0 K/uL   Eosinophils Relative 1 %   Eosinophils Absolute 0.1 0.0 - 0.7 K/uL   Basophils Relative 1 %   Basophils Absolute 0.0 0.0 - 0.1 K/uL    Current Facility-Administered Medications  Medication Dose Route Frequency Provider Last Rate Last Dose  . acetaminophen (TYLENOL) tablet 650 mg  650 mg Oral Q4H PRN Francine Graven, DO      . alum & mag hydroxide-simeth (MAALOX/MYLANTA) 200-200-20 MG/5ML suspension 30 mL  30 mL Oral PRN Francine Graven, DO      . LORazepam (ATIVAN) tablet 1 mg  1 mg Oral Q8H PRN Francine Graven, DO      . nicotine (NICODERM CQ - dosed in mg/24 hours) patch 21 mg  21 mg Transdermal Daily PRN Francine Graven, DO       No current outpatient prescriptions on file.    Musculoskeletal: Unable to assess: camera  Psychiatric Specialty Exam: Physical Exam  Review of Systems   Psychiatric/Behavioral: Positive for substance abuse. Negative for depression, hallucinations, memory loss and suicidal ideas. The patient is not nervous/anxious and does not have insomnia.   All other systems reviewed and are negative.   Blood pressure 116/72, pulse 89, temperature 97.5 F (36.4 C), temperature source Oral, resp. rate 20, height _0  (1.676 m), weight 66.7 kg (147 lb), SpO2 100 %.Body mass index is 23.73 kg/m.  General Appearance: Casual  Eye Contact:  Good  Speech:  Clear and Coherent and Normal Rate  Volume:  Normal  Mood:  Anxious  Affect:  Appropriate and Congruent  Thought Process:  Coherent, Goal Directed and Linear  Orientation:  Full (Time, Place, and Person)  Thought Content:  Logical  Suicidal Thoughts:  No  Homicidal Thoughts:  No  Memory:  Immediate;   Good Recent;   Good Remote;   Fair  Judgement:  Good  Insight:  Good  Psychomotor Activity:  Normal  Concentration:  Concentration: Good and Attention Span: Good  Recall:  Good  Fund of Knowledge:  Good  Language:  Good  Akathisia:  No  Handed:  Right  AIMS (if indicated):     Assets:  Agricultural consultant Housing Leisure Time Physical Health Resilience Social Support Transportation Vocational/Educational  ADL's:  Intact  Cognition:  WNL  Sleep:        Treatment Plan Summary: Discharge home  Provide pt with outpatient resources for therapy and substance abuse treatment Eat a regular, nutritious diet Activity as tolerated  Stay well hydrated Follow-up with PCP for any new or ongoing medical issues  Disposition: No evidence of imminent risk to self or others at present.   Patient does not meet criteria for psychiatric inpatient admission. Supportive therapy provided about ongoing stressors. Discussed crisis plan, support from social network, calling 911, coming to the Emergency Department, and calling Suicide Hotline.  Ethelene Hal,  NP 05/11/2016 8:57 AM

## 2016-05-11 NOTE — ED Provider Notes (Signed)
Patient presented clinically intoxicated with suicidal ideation. Patient no longer clinically intoxicated and no longer suicidal. Behavior health assessed and recommends outpatient resource guide.  Labs Reviewed  RAPID URINE DRUG SCREEN, HOSP PERFORMED - Abnormal; Notable for the following:       Result Value   Opiates POSITIVE (*)    Benzodiazepines POSITIVE (*)    Tetrahydrocannabinol POSITIVE (*)    All other components within normal limits  BASIC METABOLIC PANEL - Abnormal; Notable for the following:    Glucose, Bld 111 (*)    All other components within normal limits  ETHANOL - Abnormal; Notable for the following:    Alcohol, Ethyl (B) 188 (*)    All other components within normal limits  CBC WITH DIFFERENTIAL/PLATELET   Suicidal ideation  Polysubstance abuse  Auditory hallucinations     Eddie OharaJoshua Miro Balderson, MD 05/11/16 1039

## 2016-05-11 NOTE — ED Notes (Signed)
Patient ambulated to bathroom without difficulty.

## 2016-05-11 NOTE — Discharge Instructions (Signed)
If you were given medicines take as directed.  If you are on coumadin or contraceptives realize their levels and effectiveness is altered by many different medicines.  If you have any reaction (rash, tongues swelling, other) to the medicines stop taking and see a physician.    If your blood pressure was elevated in the ER make sure you follow up for management with a primary doctor or return for chest pain, shortness of breath or stroke symptoms.  Please follow up as directed and return to the ER or see a physician for new or worsening symptoms.  Thank you. Vitals:   05/10/16 1939 05/11/16 1000  BP: 116/72 138/68  Pulse: 89 88  Resp: 20 20  Temp: 97.5 F (36.4 C) 98.9 F (37.2 C)  TempSrc: Oral Oral  SpO2: 100% 100%  Weight: 147 lb (66.7 kg)   Height: 5\' 6"  (1.676 m)

## 2016-05-19 IMAGING — US US ABDOMEN COMPLETE
1 series · 13 of 25 positions shown · non-contrast
Comparison: None.

CLINICAL DATA: Right upper quadrant pain with nausea and vomiting
for the past 2 days; also body aches.

EXAM:
ULTRASOUND ABDOMEN COMPLETE

[Series 1: us abdomen complete · 0.17mm/px · 13 of 87 slices shown]
[im 1/87]
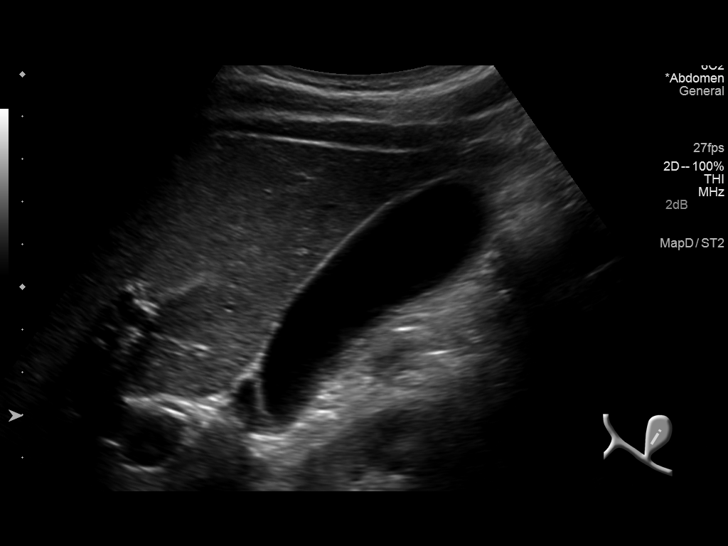
[im 8/87]
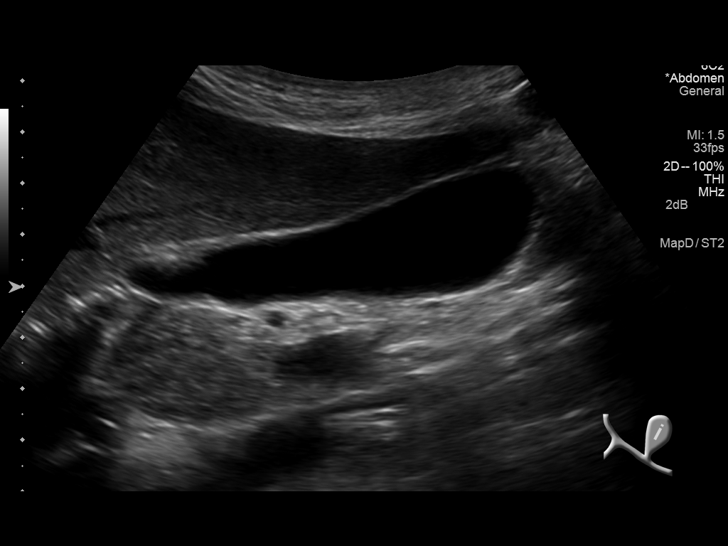
[im 15/87]
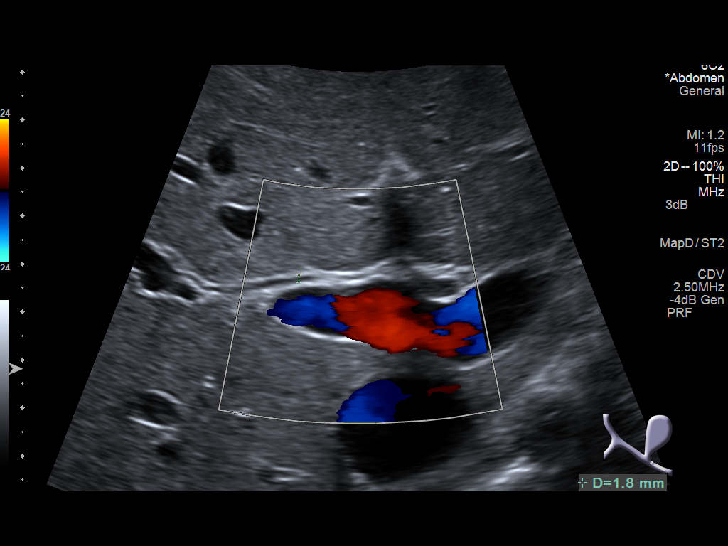
[im 22/87]
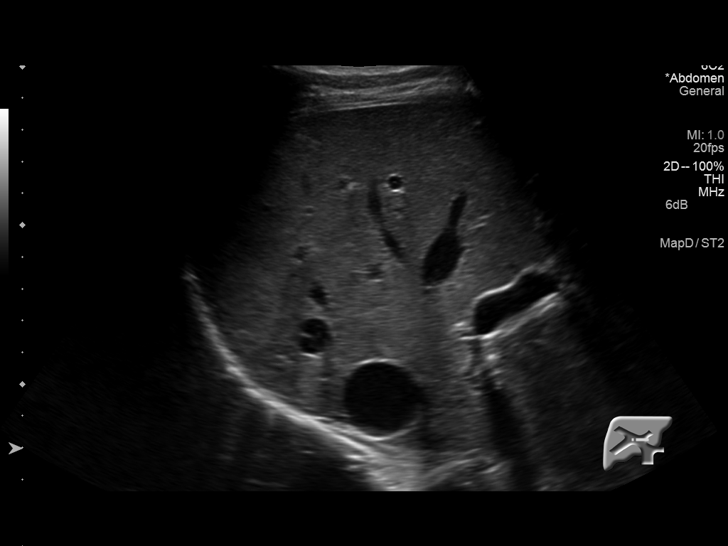
[im 29/87]
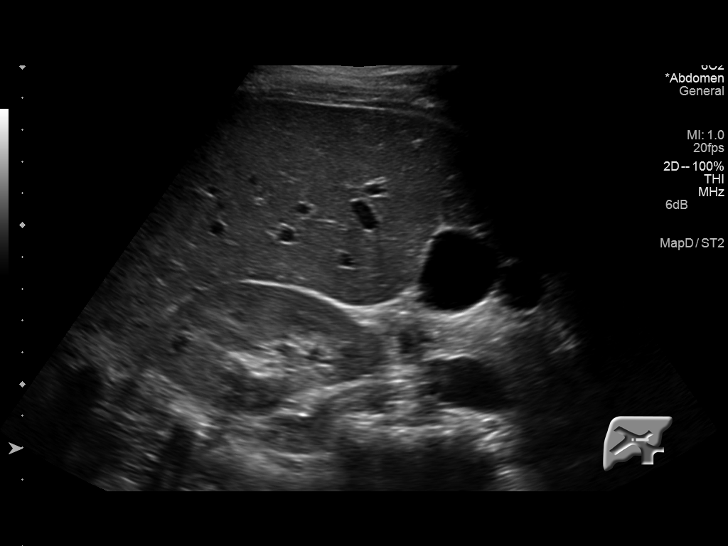
[im 36/87]
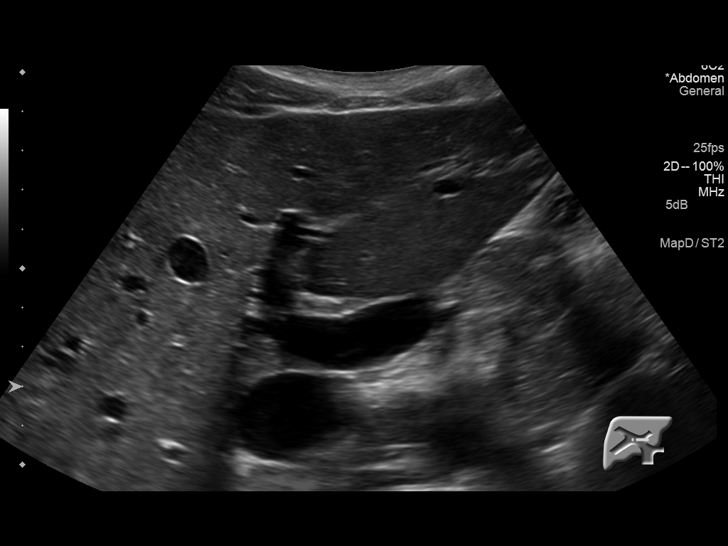
[im 44/87]
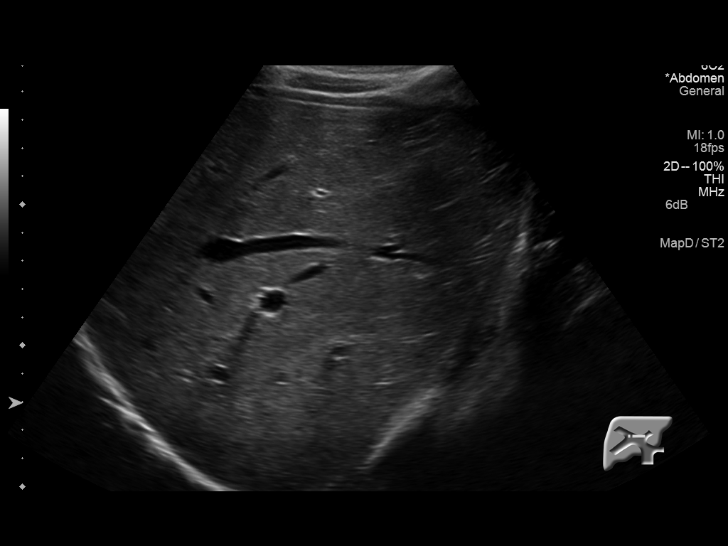
[im 51/87]
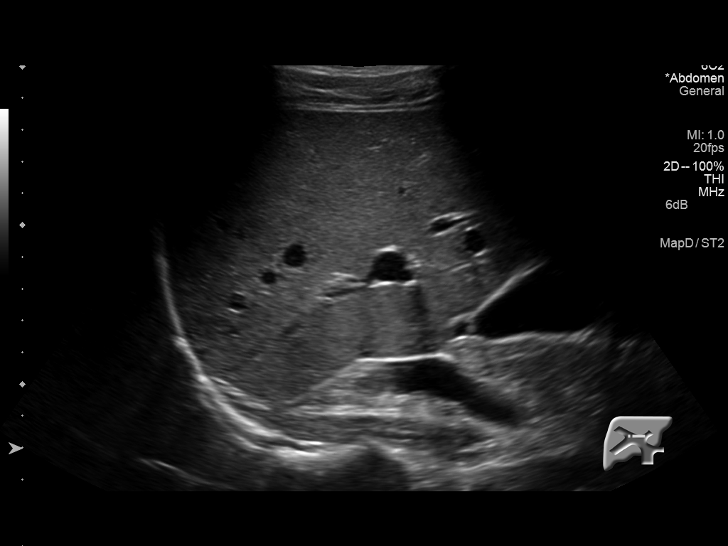
[im 58/87]
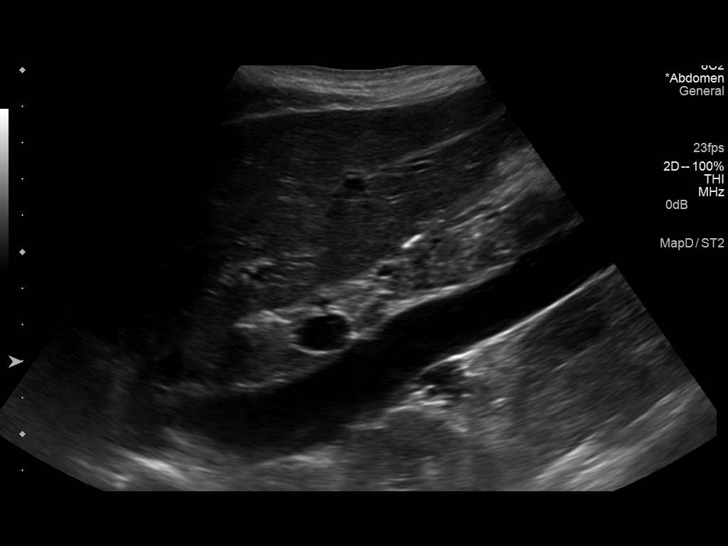
[im 65/87]
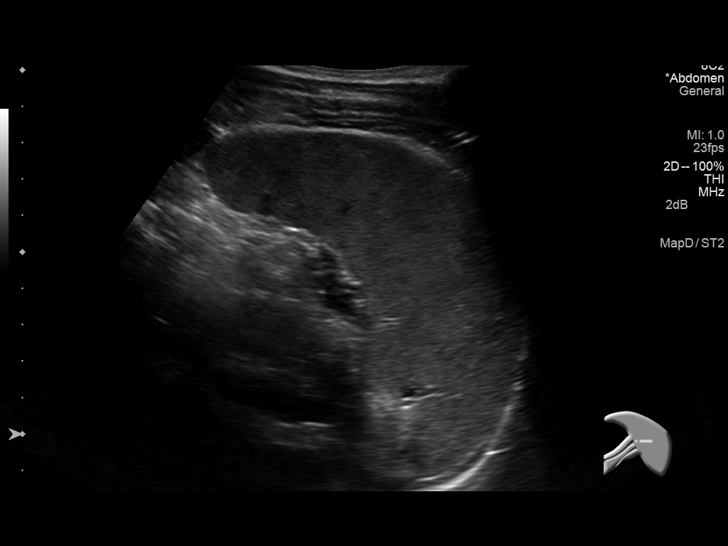
[im 72/87]
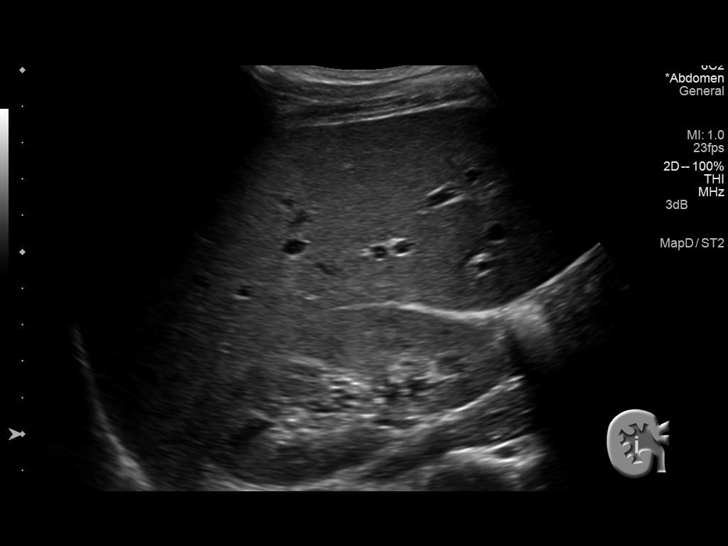
[im 79/87]
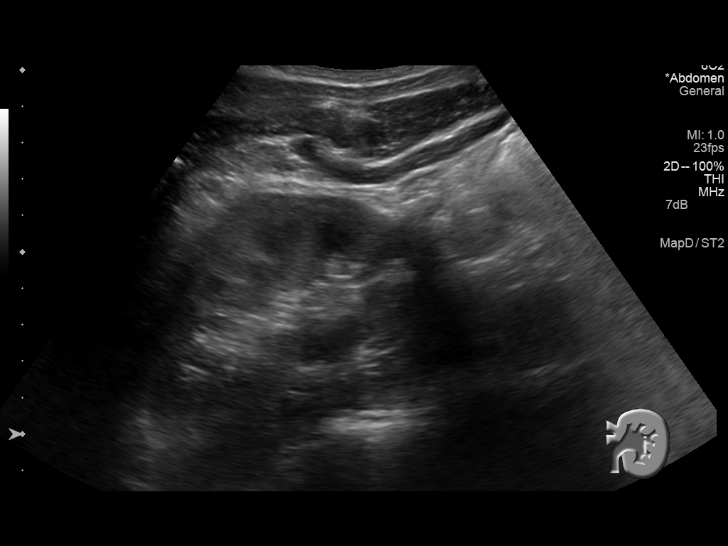
[im 87/87]
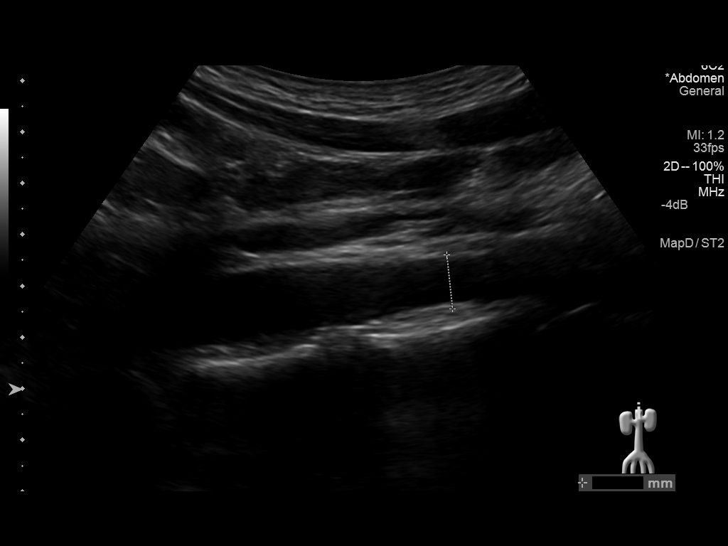

[13 of 25 positions shown; findings below may reference images not displayed]

FINDINGS: Gallbladder: The gallbladder is adequately distended. It exhibits a
Phrygian cap type contour. There are no stones nor sludge. There is
no gallbladder wall thickening, pericholecystic fluid, or positive
sonographic Murphy's sign.

Common bile duct: Diameter: 2.8 mm

Liver: The liver exhibits normal echotexture with no focal mass nor
ductal dilation.

IVC: No abnormality visualized.

Pancreas: Visualized portion unremarkable.

Spleen: Size and appearance within normal limits.

Right Kidney: Length: 10 cm. Echogenicity within normal limits. No
mass or hydronephrosis visualized.

Left Kidney: Length: 9.6 cm. Echogenicity within normal limits. No
mass or hydronephrosis visualized.

Abdominal aorta: No aneurysm visualized.

Other findings: There is no ascites.
IMPRESSION: 1. There are no gallstones and no evidence of acute cholecystitis.
No acute abnormality of the liver, pancreas, or spleen is
demonstrated.
2. The kidneys are normal in echotexture and exhibit no
hydronephrosis.
3. No acute intra-abdominal abnormality is demonstrated.

## 2017-05-05 ENCOUNTER — Other Ambulatory Visit: Payer: Self-pay

## 2017-05-05 ENCOUNTER — Emergency Department (HOSPITAL_COMMUNITY)
Admission: EM | Admit: 2017-05-05 | Discharge: 2017-05-05 | Disposition: A | Discharge status: Home / Self Care | Payer: Self-pay | Attending: Emergency Medicine | Admitting: Emergency Medicine

## 2017-05-05 ENCOUNTER — Encounter (HOSPITAL_COMMUNITY): Payer: Self-pay | Admitting: Emergency Medicine

## 2017-05-05 DIAGNOSIS — K047 Periapical abscess without sinus: Secondary | ICD-10-CM | POA: Insufficient documentation

## 2017-05-05 DIAGNOSIS — F17210 Nicotine dependence, cigarettes, uncomplicated: Secondary | ICD-10-CM | POA: Insufficient documentation

## 2017-05-05 MED ORDER — CLINDAMYCIN HCL 150 MG PO CAPS
300.0000 mg | ORAL_CAPSULE | Freq: Once | ORAL | Status: AC
  Administered 2017-05-05: 300 mg via ORAL
  Filled 2017-05-05: qty 2

## 2017-05-05 MED ORDER — TRAMADOL HCL 50 MG PO TABS: 50.0000 mg | ORAL_TABLET | Freq: Four times a day (QID) | ORAL | 0 refills | Status: DC | PRN

## 2017-05-05 MED ORDER — PROMETHAZINE HCL 12.5 MG PO TABS
12.5000 mg | ORAL_TABLET | Freq: Once | ORAL | Status: AC
  Administered 2017-05-05: 12.5 mg via ORAL
  Filled 2017-05-05: qty 1

## 2017-05-05 MED ORDER — TRAMADOL HCL 50 MG PO TABS
100.0000 mg | ORAL_TABLET | Freq: Once | ORAL | Status: AC
  Administered 2017-05-05: 100 mg via ORAL
  Filled 2017-05-05: qty 2

## 2017-05-05 MED ORDER — IBUPROFEN 800 MG PO TABS
800.0000 mg | ORAL_TABLET | Freq: Once | ORAL | Status: AC
  Administered 2017-05-05: 800 mg via ORAL
  Filled 2017-05-05: qty 1

## 2017-05-05 MED ORDER — CLINDAMYCIN HCL 150 MG PO CAPS: ORAL_CAPSULE | ORAL | 0 refills | Status: DC

## 2017-05-05 NOTE — ED Triage Notes (Signed)
Patient reports pain in his R front top tooth that started 4 days ago.

## 2017-05-05 NOTE — ED Provider Notes (Addendum)
[facility] EMERGENCY DEPARTMENT Provider Note   CSN: [id number] Arrival date & time: 05/05/17  1500     History   Chief Complaint Chief Complaint  Patient presents with  . Dental Pain    HPI Eddie Perry Eddie Perry is a 22 y.o. male.  The history is provided by the patient.  Dental Pain   This is a recurrent problem. The current episode started more than 2 days ago. The problem occurs daily. The problem has been gradually worsening. The pain is moderate. He has tried nothing for the symptoms. The treatment provided no relief.    History reviewed. No pertinent past medical history.  Patient Active Problem List   Diagnosis Date Noted  . 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 Polysubstance abuse (HCC)   . Suicidal ideation     Past Surgical History:  Procedure Laterality Date  . hypospadias surgery    . KNEE SURGERY         Home Medications    Prior to Admission medications   Not on File    Family History History reviewed. No pertinent family history.  Social History Social History   Tobacco Use  . Smoking status: Current Some Day Smoker    Types: Cigarettes  . Smokeless tobacco: Never Used  Substance Use Topics  . Alcohol use: No    Frequency: Never  . Drug use: No     Allergies   Patient has no known allergies.   Review of Systems Review of Systems  Constitutional: Negative for activity change.       All ROS Neg except as noted in HPI  HENT: Positive for dental problem. Negative for nosebleeds.   Eyes: Negative for photophobia and discharge.  Respiratory: Negative for cough, shortness of breath and wheezing.   Cardiovascular: Negative for chest pain and palpitations.  Gastrointestinal: Negative for abdominal pain and blood in stool.  Genitourinary: Negative for dysuria, frequency and hematuria.  Musculoskeletal: Negative for arthralgias, back pain and neck pain.  Skin: Negative.   Neurological: Negative  for dizziness, seizures and speech difficulty.  Psychiatric/Behavioral: Negative for confusion and hallucinations.     Physical Exam Updated Vital Signs BP 124/73 (BP Location: Right Arm)   Pulse 94   Temp 98.2 F (36.8 C) (Oral)   Resp 16   Ht 5' 6.5" (1.689 m)   Wt 66.7 kg (147 lb)   SpO2 99%   BMI 23.37 kg/m   Physical Exam  Constitutional: He is oriented to person, place, and time. He appears well-developed and well-nourished.  Non-toxic appearance.  HENT:  Head: Normocephalic.  Right Ear: Tympanic membrane and external ear normal.  Left Ear: Tympanic membrane and external ear normal.  Mild swelling of the hard palate area. Pain and swelling of the gum of the upper incisor areas. Multiple dental caries. No swelling under the tongue. Airway patent.  Eyes: EOM and lids are normal. Pupils are equal, round, and reactive to light.  Neck: Normal range of motion. Neck supple. Carotid bruit is not present.  Cardiovascular: Normal rate, regular rhythm, normal heart sounds, intact distal pulses and normal pulses.  Pulmonary/Chest: Breath sounds normal. No respiratory distress.  Abdominal: Soft. Bowel sounds are normal. There is no tenderness. There is no guarding.  Musculoskeletal: Normal range of motion.  Lymphadenopathy:       Head (right side): No submandibular adenopathy present.       Head (left side): No submandibular adenopathy present.  He has no cervical adenopathy.  Neurological: He is alert and oriented to person, place, and time. He has normal strength. No cranial nerve deficit or sensory deficit.  Skin: Skin is warm and dry.  Psychiatric: He has a normal mood and affect. His speech is normal.  Nursing note and vitals reviewed.    ED Treatments / Results  Labs (all labs ordered are listed, but only abnormal results are displayed) Labs Reviewed - No data to display  EKG  EKG Interpretation None       Radiology No results  found.  Procedures Procedures (including critical care time)  Medications Ordered in ED Medications - No data to display   Initial Impression / Assessment and Plan / ED Course  I have reviewed the triage vital signs and the nursing notes.  Pertinent labs & imaging results that were available during my care of the patient were reviewed by me and considered in my medical decision making (see chart for details).       Final Clinical Impressions(s) / ED Diagnoses Vital signs within normal limits.  Pulse oximetry is 99% on room air.  Within normal limits by my interpretation.  Patient has had a problem with a cracked tooth for a couple of years now.  He now has some swelling and severe pain of the upper incisors.  There is swelling along the area of the hard palate.  The airway however is patent and the speech is clear and understandable.  There is no swelling under the tongue and no evidence for Ludewig's angina or other emergent or acute changes.  I discussed with the patient the importance of him seeing a dentist as soon as possible.  Prescription for clindamycin given to the patient.  The patient is to use ibuprofen 600mg  4 times daily with meals.Rx for 12 tabs of ultram given.   Pt checked on PMP Aware data base. No recent narcotic Rx noted.   Final diagnoses:  Dental infection    ED Discharge Orders    None       Ivery QualeBryant, Montravious Weigelt, Cordelia Poche-C 05/05/17 1710    Mancel BaleWentz, Elliott, MD 05/08/17 2351    Ivery QualeBryant, Maurisio Ruddy, PA-C 05/13/17 1059    Mancel BaleWentz, Elliott, MD 05/15/17 [phone]

## 2017-05-05 NOTE — Discharge Instructions (Signed)
Your examination is consistent with a dental infection.  Please use clindamycin 2 times daily with food.  Please use 600 mg of ibuprofen with breakfast, lunch, dinner, and at bedtime for swelling and inflammation.  May use Ultram for pain if needed.This medication may cause drowsiness. Please do not drink, drive, or participate in activity that requires concentration while taking this medication.  It is extremely important that you see a dentist as soon as possible.  Dental resources information has been provided in today's discharge instructions.
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
## 2017-12-12 ENCOUNTER — Emergency Department (HOSPITAL_COMMUNITY): Payer: Self-pay

## 2017-12-12 ENCOUNTER — Other Ambulatory Visit: Payer: Self-pay

## 2017-12-12 ENCOUNTER — Encounter (HOSPITAL_COMMUNITY): Payer: Self-pay | Admitting: Emergency Medicine

## 2017-12-12 ENCOUNTER — Emergency Department (HOSPITAL_COMMUNITY)
Admission: EM | Admit: 2017-12-12 | Discharge: 2017-12-12 | Disposition: A | Discharge status: Home / Self Care | Payer: Self-pay | Attending: Emergency Medicine | Admitting: Emergency Medicine

## 2017-12-12 DIAGNOSIS — S93602A Unspecified sprain of left foot, initial encounter: Secondary | ICD-10-CM | POA: Insufficient documentation

## 2017-12-12 DIAGNOSIS — Y939 Activity, unspecified: Secondary | ICD-10-CM | POA: Insufficient documentation

## 2017-12-12 DIAGNOSIS — W230XXA Caught, crushed, jammed, or pinched between moving objects, initial encounter: Secondary | ICD-10-CM | POA: Insufficient documentation

## 2017-12-12 DIAGNOSIS — Y929 Unspecified place or not applicable: Secondary | ICD-10-CM | POA: Insufficient documentation

## 2017-12-12 DIAGNOSIS — F17210 Nicotine dependence, cigarettes, uncomplicated: Secondary | ICD-10-CM | POA: Insufficient documentation

## 2017-12-12 DIAGNOSIS — Y998 Other external cause status: Secondary | ICD-10-CM | POA: Insufficient documentation

## 2017-12-12 MED ORDER — IBUPROFEN 800 MG PO TABS: 800.0000 mg | ORAL_TABLET | Freq: Three times a day (TID) | ORAL | 0 refills | Status: DC

## 2017-12-12 MED ORDER — IBUPROFEN 800 MG PO TABS
800.0000 mg | ORAL_TABLET | Freq: Once | ORAL | Status: AC
  Administered 2017-12-12: 800 mg via ORAL
  Filled 2017-12-12: qty 1
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
## 2017-12-12 NOTE — Discharge Instructions (Addendum)
Elevate and apply ice packs on and off to your foot.  Use the crutches as needed for support and weightbearing.  Call the orthopedic provider listed in 1 week if your symptoms are not improving.
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
## 2019-07-03 ENCOUNTER — Emergency Department (HOSPITAL_COMMUNITY)
Admission: EM | Admit: 2019-07-03 | Discharge: 2019-07-04 | Disposition: A | Discharge status: Home / Self Care | Payer: Self-pay | Attending: Emergency Medicine | Admitting: Emergency Medicine

## 2019-07-03 ENCOUNTER — Other Ambulatory Visit: Payer: Self-pay

## 2019-07-03 ENCOUNTER — Encounter (HOSPITAL_COMMUNITY): Payer: Self-pay | Admitting: Emergency Medicine

## 2019-07-03 ENCOUNTER — Emergency Department (HOSPITAL_COMMUNITY): Payer: Self-pay

## 2019-07-03 DIAGNOSIS — Y939 Activity, unspecified: Secondary | ICD-10-CM | POA: Insufficient documentation

## 2019-07-03 DIAGNOSIS — Y929 Unspecified place or not applicable: Secondary | ICD-10-CM | POA: Insufficient documentation

## 2019-07-03 DIAGNOSIS — F17210 Nicotine dependence, cigarettes, uncomplicated: Secondary | ICD-10-CM | POA: Insufficient documentation

## 2019-07-03 DIAGNOSIS — S20212A Contusion of left front wall of thorax, initial encounter: Secondary | ICD-10-CM | POA: Insufficient documentation

## 2019-07-03 DIAGNOSIS — W01198A Fall on same level from slipping, tripping and stumbling with subsequent striking against other object, initial encounter: Secondary | ICD-10-CM | POA: Insufficient documentation

## 2019-07-03 DIAGNOSIS — Y999 Unspecified external cause status: Secondary | ICD-10-CM | POA: Insufficient documentation

## 2019-07-03 MED ORDER — HYDROCODONE-ACETAMINOPHEN 5-325 MG PO TABS
1.0000 | ORAL_TABLET | Freq: Once | ORAL | Status: AC
  Administered 2019-07-03: 1 via ORAL
  Filled 2019-07-03: qty 1

## 2019-07-03 MED ORDER — IBUPROFEN 800 MG PO TABS: 800.0000 mg | ORAL_TABLET | Freq: Three times a day (TID) | ORAL | 0 refills | Status: DC

## 2019-07-03 MED ORDER — HYDROCODONE-ACETAMINOPHEN 5-325 MG PO TABS: ORAL_TABLET | ORAL | 0 refills | Status: AC
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
## 2019-07-03 NOTE — ED Notes (Signed)
To Rad 
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
## 2019-07-03 NOTE — Discharge Instructions (Addendum)
You may apply ice or heat on and off to your chest wall.  Follow-up with your primary doctor return to the emergency department if you develop any worsening symptoms such as sudden, increased chest pain, or shortness of breath

## 2020-12-06 ENCOUNTER — Encounter (HOSPITAL_COMMUNITY): Payer: Self-pay

## 2020-12-06 ENCOUNTER — Emergency Department (HOSPITAL_COMMUNITY)
Admission: EM | Admit: 2020-12-06 | Discharge: 2020-12-06 | Disposition: A | Discharge status: Home / Self Care | Payer: Medicaid Other | Attending: Emergency Medicine | Admitting: Emergency Medicine

## 2020-12-06 ENCOUNTER — Other Ambulatory Visit: Payer: Self-pay

## 2020-12-06 DIAGNOSIS — K047 Periapical abscess without sinus: Secondary | ICD-10-CM | POA: Insufficient documentation

## 2020-12-06 DIAGNOSIS — F17210 Nicotine dependence, cigarettes, uncomplicated: Secondary | ICD-10-CM | POA: Insufficient documentation

## 2020-12-06 MED ORDER — AMOXICILLIN-POT CLAVULANATE 875-125 MG PO TABS
1.0000 | ORAL_TABLET | Freq: Once | ORAL | Status: DC
  Filled 2020-12-06: qty 1

## 2020-12-06 MED ORDER — OXYCODONE-ACETAMINOPHEN 5-325 MG PO TABS
1.0000 | ORAL_TABLET | Freq: Once | ORAL | Status: AC
  Administered 2020-12-06: 1 via ORAL
  Filled 2020-12-06: qty 1

## 2020-12-06 MED ORDER — LIDOCAINE-EPINEPHRINE 2 %-1:200000 IJ SOLN
10.0000 mL | Freq: Once | INTRAMUSCULAR | Status: AC
Start: 2020-12-06 — End: 2020-12-06
  Administered 2020-12-06: 10 mL
  Filled 2020-12-06: qty 20

## 2020-12-06 MED ORDER — AMOXICILLIN-POT CLAVULANATE 875-125 MG PO TABS: 1.0000 | ORAL_TABLET | Freq: Two times a day (BID) | ORAL | 0 refills | Status: AC

## 2020-12-06 NOTE — ED Notes (Signed)
Pt family member asking for antibiotic before pt leaves, doctor notified

## 2020-12-06 NOTE — ED Provider Notes (Signed)
[facility] EMERGENCY DEPARTMENT Provider Note   CSN: [id number] Arrival date & time: 12/06/20  1622     History Chief Complaint  Patient presents with   Dental Pain    Front upper tooth pain x 3 days    Eddie Perry is a 25 y.o. male presents with dental pain x 3 days. Patient is complaining of pain over the right upper incisor and now has right sided facial pain and lip swelling. He has tried tylenol for pain without relief. He reports pain with chewing and has been avoiding food. No fevers, chills, sore throat, dysphagia, nausea, or vomiting. No other complaints. Does not take any medication on daily basis, no allergies to medications.   Dental Pain Associated symptoms: no drooling and no fever       History reviewed. No pertinent past medical history.  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  Social History   Tobacco Use   Smoking status: Some Days    Packs/day: 0.50    Types: Cigarettes   Smokeless tobacco: Never  Vaping Use   Vaping Use: Never used  Substance Use Topics   Alcohol use: No   Drug use: No    Home Medications Prior to Admission medications   Medication Sig Start Date End Date Taking? Authorizing Provider  amoxicillin-clavulanate (AUGMENTIN) 875-125 MG tablet Take 1 tablet by mouth every 12 (twelve) hours for 7 days. 12/06/20 12/13/20 Yes Malissie Musgrave T, PA-C  HYDROcodone-acetaminophen (NORCO/VICODIN) 5-325 MG tablet Take one tab po q 4 hrs prn pain 07/03/19   Triplett, Tammy, PA-C  ibuprofen (ADVIL) 200 MG tablet Take 800 mg by mouth daily as needed for mild pain or moderate pain.    [provider]  ibuprofen (ADVIL) 800 MG tablet Take 1 tablet (800 mg total) by  mouth 3 (three) times daily. 07/03/19   Triplett, Babette Relic, PA-C    Allergies    Patient has no known allergies.  Review of Systems   Review of Systems  Constitutional:  Negative for chills and fever.  HENT:  Positive for dental problem. Negative for drooling, sore throat and trouble swallowing.   Respiratory:  Negative for cough.   Gastrointestinal:  Negative for nausea and vomiting.  All other systems reviewed and are negative.  Physical Exam Updated Vital Signs BP 128/74 (BP Location: Right Arm)   Pulse 86   Temp 98 F (36.7 C) (Oral)   Resp 16   Ht 5\' 5"  (1.651 m)   Wt 68 kg   SpO2 99%   BMI 24.96 kg/m   Physical Exam Vitals and nursing note reviewed.  Constitutional:      Appearance: Normal appearance.  HENT:     Head: Normocephalic and atraumatic.     Mouth/Throat:     Mouth: Mucous membranes are moist.     Dentition: Dental caries and dental abscesses present.     Pharynx: Uvula midline.     Comments: Abscess approximately 1 cm above right upper incisor with erythema and fluctuance. Swelling of upper lip without lesion. Eyes:     Conjunctiva/sclera: Conjunctivae normal.  Pulmonary:     Effort: Pulmonary effort is normal.  No respiratory distress.  Skin:    General: Skin is warm and dry.  Neurological:     Mental Status: He is alert.  Psychiatric:        Mood and Affect: Mood normal.        Behavior: Behavior normal.        ED Results / Procedures / Treatments   Labs (all labs ordered are listed, but only abnormal results are displayed) Labs Reviewed - No data to display  EKG None  Radiology No results found.  Procedures .Marland KitchenIncision and Drainage  Date/Time: 12/06/2020 7:31 PM Performed by: Su Monks, PA-C Authorized by: Su Monks, PA-C   Consent:    Consent obtained:  Verbal   Consent given by:  Patient   Risks, benefits, and alternatives were discussed: yes     Risks discussed:  Incomplete drainage, pain and  bleeding Universal protocol:    Patient identity confirmed:  Verbally with patient Location:    Type:  Abscess   Location:  Mouth   Mouth location: apical dental abscess. Anesthesia:    Anesthesia method:  Local infiltration   Local anesthetic:  Lidocaine 2% WITH epi Post-procedure details:    Procedure completion:  Procedure terminated at patient's request   Medications Ordered in ED Medications  lidocaine-EPINEPHrine (XYLOCAINE W/EPI) 2 %-1:200000 (PF) injection 10 mL (has no administration in time range)  oxyCODONE-acetaminophen (PERCOCET/ROXICET) 5-325 MG per tablet 1 tablet (has no administration in time range)    ED Course  I have reviewed the triage vital signs and the nursing notes.  Pertinent labs & imaging results that were available during my care of the patient were reviewed by me and considered in my medical decision making (see chart for details).    MDM Rules/Calculators/A&P                           Patient is 25 y/o male who presents with dental pain x 3 days. Dental abscess approximately 1 cm noted above right upper incisor.    Patient with dental abscess amenable to incision and drainage. Procedure performed in conjunction with Dr. Posey Rea MD. Local infiltration with 2% lidocaine. Incision attempt made without drainage. Patient denied further procedure due to pain. Will give one dose pain medication prior to discharge. Discussed importance of following up with dentist outpatient. Will discharge on antibiotics.   Final Clinical Impression(s) / ED Diagnoses Final diagnoses:  Dental abscess    Rx / DC Orders ED Discharge Orders          Ordered    amoxicillin-clavulanate (AUGMENTIN) 875-125 MG tablet  Every 12 hours        12/06/20 1929             Jeanella Flattery 12/06/20 1932    Kommor, [place], MD 12/07/20 1321    Kommor, [place], MD 12/07/20 1340

## 2020-12-06 NOTE — ED Notes (Signed)
I went into pt room to deliver antibiotic and pt has left and not in the lobby

## 2020-12-06 NOTE — Discharge Instructions (Addendum)
I am attaching instructions for how to care for the incision site as well as writing you a prescription for antibiotics to take twice daily for the next 7 days. It is important you finish the entire course of antibiotics.   It is incredibly important you follow up with a dentist. I've attached contact information for several dental offices in the area.

## 2020-12-07 ENCOUNTER — Encounter (HOSPITAL_COMMUNITY): Payer: Self-pay | Admitting: Student

## 2020-12-07 NOTE — Progress Notes (Signed)
CSW updated that MD is attempting to contact pt for follow up on test ran at visit in ED. Pt has no contact information listed. CSW spoke to [facility] police department for contact information. CSW provided Dr. Posey Rea with Eddie Perry (pts mother's) number [phone]. If this number does not work, [employer] police will complete welfare check and request pt call MD about information.

## 2021-10-30 DIAGNOSIS — M25561 Pain in right knee: Secondary | ICD-10-CM | POA: Diagnosis not present

## 2022-05-16 ENCOUNTER — Telehealth: Payer: Self-pay

## 2022-05-16 NOTE — Telephone Encounter (Signed)
Mychart msg sent. AS, CMA

## 2022-05-27 ENCOUNTER — Other Ambulatory Visit: Payer: Self-pay

## 2022-05-27 ENCOUNTER — Encounter (HOSPITAL_COMMUNITY): Payer: Self-pay | Admitting: Emergency Medicine

## 2022-05-27 ENCOUNTER — Emergency Department (HOSPITAL_COMMUNITY)

## 2022-05-27 ENCOUNTER — Emergency Department (HOSPITAL_COMMUNITY)
Admission: EM | Admit: 2022-05-27 | Discharge: 2022-05-28 | Disposition: A | Discharge status: Home / Self Care | Attending: Emergency Medicine | Admitting: Emergency Medicine

## 2022-05-27 DIAGNOSIS — F10930 Alcohol use, unspecified with withdrawal, uncomplicated: Secondary | ICD-10-CM

## 2022-05-27 DIAGNOSIS — R000 Tachycardia, unspecified: Secondary | ICD-10-CM | POA: Diagnosis not present

## 2022-05-27 DIAGNOSIS — F1193 Opioid use, unspecified with withdrawal: Secondary | ICD-10-CM

## 2022-05-27 DIAGNOSIS — F1123 Opioid dependence with withdrawal: Secondary | ICD-10-CM | POA: Insufficient documentation

## 2022-05-27 DIAGNOSIS — R079 Chest pain, unspecified: Secondary | ICD-10-CM | POA: Diagnosis not present

## 2022-05-27 DIAGNOSIS — F10230 Alcohol dependence with withdrawal, uncomplicated: Secondary | ICD-10-CM | POA: Insufficient documentation

## 2022-05-27 DIAGNOSIS — Z1152 Encounter for screening for COVID-19: Secondary | ICD-10-CM | POA: Diagnosis not present

## 2022-05-27 DIAGNOSIS — R4182 Altered mental status, unspecified: Secondary | ICD-10-CM | POA: Diagnosis not present

## 2022-05-27 DIAGNOSIS — R569 Unspecified convulsions: Secondary | ICD-10-CM | POA: Diagnosis not present

## 2022-05-27 LAB — RAPID URINE DRUG SCREEN, HOSP PERFORMED
Amphetamines: NOT DETECTED
Barbiturates: NOT DETECTED
Benzodiazepines: NOT DETECTED
Cocaine: NOT DETECTED
Opiates: NOT DETECTED
Tetrahydrocannabinol: NOT DETECTED

## 2022-05-27 LAB — URINALYSIS, MICROSCOPIC (REFLEX)

## 2022-05-27 LAB — URINALYSIS, ROUTINE W REFLEX MICROSCOPIC
Bilirubin Urine: NEGATIVE
Glucose, UA: NEGATIVE mg/dL
Ketones, ur: 15 mg/dL — AB
Leukocytes,Ua: NEGATIVE
Nitrite: NEGATIVE
Protein, ur: 30 mg/dL — AB
Specific Gravity, Urine: 1.01 (ref 1.005–1.030)
pH: 8.5 — ABNORMAL HIGH (ref 5.0–8.0)

## 2022-05-27 LAB — COMPREHENSIVE METABOLIC PANEL
ALT: 19 U/L (ref 0–44)
AST: 48 U/L — ABNORMAL HIGH (ref 15–41)
Albumin: 4.8 g/dL (ref 3.5–5.0)
Alkaline Phosphatase: 66 U/L (ref 38–126)
Anion gap: 18 — ABNORMAL HIGH (ref 5–15)
BUN: 14 mg/dL (ref 6–20)
CO2: 24 mmol/L (ref 22–32)
Calcium: 9.8 mg/dL (ref 8.9–10.3)
Chloride: 98 mmol/L (ref 98–111)
Creatinine, Ser: 1.25 mg/dL — ABNORMAL HIGH (ref 0.61–1.24)
GFR, Estimated: >60 mL/min (ref >60)
Glucose, Bld: 148 mg/dL — ABNORMAL HIGH (ref 70–99)
Potassium: 3.6 mmol/L (ref 3.5–5.1)
Sodium: 140 mmol/L (ref 135–145)
Total Bilirubin: 0.8 mg/dL (ref 0.3–1.2)
Total Protein: 7.8 g/dL (ref 6.5–8.1)

## 2022-05-27 LAB — CBC WITH DIFFERENTIAL/PLATELET
Abs Immature Granulocytes: 0.09 10*3/uL — ABNORMAL HIGH (ref 0.00–0.07)
Basophils Absolute: 0 10*3/uL (ref 0.0–0.1)
Basophils Relative: 0 %
Eosinophils Absolute: 0 10*3/uL (ref 0.0–0.5)
Eosinophils Relative: 0 %
HCT: 44.2 % (ref 39.0–52.0)
Hemoglobin: 15.7 g/dL (ref 13.0–17.0)
Immature Granulocytes: 1 %
Lymphocytes Relative: 8 %
Lymphs Abs: 1.5 10*3/uL (ref 0.7–4.0)
MCH: 29.3 pg (ref 26.0–34.0)
MCHC: 35.5 g/dL (ref 30.0–36.0)
MCV: 82.6 fL (ref 80.0–100.0)
Monocytes Absolute: 1 10*3/uL (ref 0.1–1.0)
Monocytes Relative: 5 %
Neutro Abs: 16.3 10*3/uL — ABNORMAL HIGH (ref 1.7–7.7)
Neutrophils Relative %: 86 %
Platelets: 385 10*3/uL (ref 150–400)
RBC: 5.35 MIL/uL (ref 4.22–5.81)
RDW: 13.9 % (ref 11.5–15.5)
WBC: 18.9 10*3/uL — ABNORMAL HIGH (ref 4.0–10.5)
nRBC: 0 % (ref 0.0–0.2)

## 2022-05-27 LAB — MAGNESIUM: Magnesium: 1.7 mg/dL (ref 1.7–2.4)

## 2022-05-27 LAB — RESP PANEL BY RT-PCR (RSV, FLU A&B, COVID)  RVPGX2
Influenza A by PCR: NEGATIVE
Influenza B by PCR: NEGATIVE
Resp Syncytial Virus by PCR: NEGATIVE
SARS Coronavirus 2 by RT PCR: NEGATIVE

## 2022-05-27 LAB — LIPASE, BLOOD: Lipase: 26 U/L (ref 11–51)

## 2022-05-27 LAB — TROPONIN I (HIGH SENSITIVITY)
Troponin I (High Sensitivity): 3 ng/L (ref <18)
Troponin I (High Sensitivity): 4 ng/L (ref <18)

## 2022-05-27 MED ORDER — OXYCODONE-ACETAMINOPHEN 5-325 MG PO TABS
1.0000 | ORAL_TABLET | Freq: Once | ORAL | Status: AC
  Administered 2022-05-27: 1 via ORAL
  Filled 2022-05-27: qty 1

## 2022-05-27 MED ORDER — CHLORDIAZEPOXIDE HCL 25 MG PO CAPS
50.0000 mg | ORAL_CAPSULE | Freq: Once | ORAL | Status: AC
  Administered 2022-05-27: 50 mg via ORAL
  Filled 2022-05-27: qty 2

## 2022-05-27 MED ORDER — METOCLOPRAMIDE HCL 5 MG/ML IJ SOLN
10.0000 mg | Freq: Once | INTRAMUSCULAR | Status: AC
  Administered 2022-05-28: 10 mg via INTRAVENOUS
  Filled 2022-05-27: qty 2

## 2022-05-27 MED ORDER — IOHEXOL 350 MG/ML SOLN
100.0000 mL | Freq: Once | INTRAVENOUS | Status: AC | PRN
  Administered 2022-05-28: 100 mL via INTRAVENOUS

## 2022-05-27 MED ORDER — LORAZEPAM 2 MG/ML IJ SOLN
2.0000 mg | Freq: Once | INTRAMUSCULAR | Status: AC
Start: 2022-05-27 — End: 2022-05-27
  Administered 2022-05-27: 2 mg via INTRAVENOUS
  Filled 2022-05-27: qty 1

## 2022-05-27 MED ORDER — LACTATED RINGERS IV BOLUS
2000.0000 mL | Freq: Once | INTRAVENOUS | Status: AC
  Administered 2022-05-27: 2000 mL via INTRAVENOUS

## 2022-05-27 NOTE — ED Provider Notes (Signed)
[facility] Provider Note   CSN: [id number] Arrival date & time: 05/27/22  1949     History {Add pertinent medical, surgical, social history, OB history to HPI:1} Chief Complaint  Patient presents with   ETOH/Drug Withdrawals    Eddie Perry is a 27 y.o. male.  HPI Patient presents for concern of withdrawal.  He arrives from jail.  He has been in jail for the past 3 days.  Prior to being incarcerated, he was drinking 12 to 24 cans of beer daily.  He also states that he was snorting heroin daily.  Patient endorses diffuse body pains, chest pains, nausea.  He denies hallucinations.  He states that he has never had withdrawal symptoms like this in the past.    Home Medications Prior to Admission medications   Medication Sig Start Date End Date Taking? Authorizing Provider  HYDROcodone-acetaminophen (NORCO/VICODIN) 5-325 MG tablet Take one tab po q 4 hrs prn pain 07/03/19   Triplett, Tammy, PA-C  ibuprofen (ADVIL) 200 MG tablet Take 800 mg by mouth daily as needed for mild pain or moderate pain.    [provider]  ibuprofen (ADVIL) 800 MG tablet Take 1 tablet (800 mg total) by mouth 3 (three) times daily. 07/03/19   Triplett, Lynelle Smoke, PA-C      Allergies    Patient has no known allergies.    Review of Systems   Review of Systems  Cardiovascular:  Positive for chest pain.  Gastrointestinal:  Positive for nausea.  Musculoskeletal:  Positive for arthralgias and myalgias.  All other systems reviewed and are negative.   Physical Exam Updated Vital Signs BP (!) 143/87 (BP Location: Left Arm)   Pulse (!) 110   Resp 18   Ht 5' 5"$  (1.651 m)   Wt 61.2 kg   SpO2 100%   BMI 22.47 kg/m  Physical Exam Vitals and nursing note reviewed.  Constitutional:      General: He is not in acute distress.    Appearance: Normal appearance. He is well-developed. He is not ill-appearing, toxic-appearing or diaphoretic.  HENT:     Head:  Normocephalic and atraumatic.     Right Ear: External ear normal.     Left Ear: External ear normal.     Nose: Nose normal.     Mouth/Throat:     Mouth: Mucous membranes are moist.  Eyes:     Extraocular Movements: Extraocular movements intact.     Conjunctiva/sclera: Conjunctivae normal.  Cardiovascular:     Rate and Rhythm: Regular rhythm. Tachycardia present.     Heart sounds: No murmur heard. Pulmonary:     Effort: Pulmonary effort is normal. No respiratory distress.     Breath sounds: Normal breath sounds. No wheezing or rales.  Abdominal:     General: There is no distension.     Palpations: Abdomen is soft.     Tenderness: There is no abdominal tenderness.  Musculoskeletal:        General: No swelling. Normal range of motion.     Cervical back: Normal range of motion and neck supple.     Right lower leg: No edema.     Left lower leg: No edema.  Skin:    General: Skin is warm and dry.     Coloration: Skin is not jaundiced or pale.  Neurological:     General: No focal deficit present.     Mental Status: He is alert and oriented to person, place,  and time.     Cranial Nerves: No cranial nerve deficit.     Sensory: No sensory deficit.     Motor: No weakness.     Coordination: Coordination normal.  Psychiatric:        Mood and Affect: Mood normal.        Behavior: Behavior normal.        Thought Content: Thought content normal.        Judgment: Judgment normal.     ED Results / Procedures / Treatments   Labs (all labs ordered are listed, but only abnormal results are displayed) Labs Reviewed  CBC WITH DIFFERENTIAL/PLATELET  COMPREHENSIVE METABOLIC PANEL  RAPID URINE DRUG SCREEN, HOSP PERFORMED    EKG None  Radiology No results found.  Procedures Procedures  {Document cardiac monitor, telemetry assessment procedure when appropriate:1}  Medications Ordered in ED Medications - No data to display  ED Course/ Medical Decision Making/ A&P   {   Click here  for ABCD2, HEART and other calculatorsREFRESH Note before signing :1}                          Medical Decision Making Amount and/or Complexity of Data Reviewed Labs: ordered. Radiology: ordered.  Risk Prescription drug management.   This patient presents to the ED for concern of ***, this involves an extensive number of treatment options, and is a complaint that carries with it a high risk of complications and morbidity.  The differential diagnosis includes ***   Co morbidities that complicate the patient evaluation  ***   Additional history obtained:  Additional history obtained from *** External records from outside source obtained and reviewed including ***   Lab Tests:  I Ordered, and personally interpreted labs.  The pertinent results include:  ***   Imaging Studies ordered:  I ordered imaging studies including ***  I independently visualized and interpreted imaging which showed *** I agree with the radiologist interpretation   Cardiac Monitoring: / EKG:  The patient was maintained on a cardiac monitor.  I personally viewed and interpreted the cardiac monitored which showed an underlying rhythm of: ***   Consultations Obtained:  I requested consultation with the ***,  and discussed lab and imaging findings as well as pertinent plan - they recommend: ***   Problem List / ED Course / Critical interventions / Medication management  *** I ordered medication including ***  for ***  Reevaluation of the patient after these medicines showed that the patient {resolved/improved/worsened:23923::"improved"} I have reviewed the patients home medicines and have made adjustments as needed   Social Determinants of Health:  ***   Test / Admission - Considered:  ***   {Document critical care time when appropriate:1} {Document review of labs and clinical decision tools ie heart score, Chads2Vasc2 etc:1}  {Document your independent review of radiology images, and  any outside records:1} {Document your discussion with family members, caretakers, and with consultants:1} {Document social determinants of health affecting pt's care:1} {Document your decision making why or why not admission, treatments were needed:1} Final Clinical Impression(s) / ED Diagnoses Final diagnoses:  None    Rx / DC Orders ED Discharge Orders     None

## 2022-05-27 NOTE — ED Triage Notes (Addendum)
Pt bib EMS from jail after pt was found in the floor "shaking". Pt reports that he snorts approximately "a gram and a half of heroin a day" and drinks approximately "12-24 cans of beer a day or whatever he can get his hands on".  Last use was 3 days ago when he went to jail. No LOC was noted by jail staff or EMS. EMS did not see in seizure-like activity. States pt was tachycardic. Blood Glucose was 135 en route.

## 2022-05-28 LAB — CK: Total CK: 165 U/L (ref 49–397)

## 2022-05-28 MED ORDER — CHLORDIAZEPOXIDE HCL 25 MG PO CAPS: ORAL_CAPSULE | ORAL | 0 refills | Status: AC

## 2022-05-28 MED ORDER — ONDANSETRON 4 MG PO TBDP: ORAL_TABLET | ORAL | 0 refills | Status: AC

## 2022-05-28 MED ORDER — LORAZEPAM 2 MG/ML IJ SOLN
1.0000 mg | Freq: Once | INTRAMUSCULAR | Status: AC
  Administered 2022-05-28: 1 mg via INTRAVENOUS
  Filled 2022-05-28: qty 1

## 2022-05-28 MED ORDER — LOPERAMIDE HCL 2 MG PO CAPS: 2.0000 mg | ORAL_CAPSULE | Freq: Four times a day (QID) | ORAL | 0 refills | Status: AC | PRN

## 2022-05-28 NOTE — ED Provider Notes (Signed)
1:49 AM Assumed care from Dr. Doren Custard, please see their note for full history, physical and decision making until this point. In brief this is a 27 y.o. year old male who presented to the ED tonight with ETOH/Drug Withdrawals     Possibly with withdrawals, but unclear. Being treated. If tolerating PO then can go back to jail.   Tolerating PO. VS consistently WNL. Low suspicion for acute withdrawal at this time. Complete workup completed by previous doctor without evidence of infectious source. Will d/c on symptomatic treatment back to custody.   Discharge instructions, including strict return precautions for new or worsening symptoms, given. Patient and/or family verbalized understanding and agreement with the plan as described.   Labs, studies and imaging reviewed by myself and considered in medical decision making if ordered. Imaging interpreted by radiology.  Labs Reviewed  CBC WITH DIFFERENTIAL/PLATELET - Abnormal; Notable for the following components:      Result Value   WBC 18.9 (*)    Neutro Abs 16.3 (*)    Abs Immature Granulocytes 0.09 (*)    All other components within normal limits  COMPREHENSIVE METABOLIC PANEL - Abnormal; Notable for the following components:   Glucose, Bld 148 (*)    Creatinine, Ser 1.25 (*)    AST 48 (*)    Anion gap 18 (*)    All other components within normal limits  URINALYSIS, ROUTINE W REFLEX MICROSCOPIC - Abnormal; Notable for the following components:   pH 8.5 (*)    Hgb urine dipstick TRACE (*)    Ketones, ur 15 (*)    Protein, ur 30 (*)    All other components within normal limits  URINALYSIS, MICROSCOPIC (REFLEX) - Abnormal; Notable for the following components:   Bacteria, UA FEW (*)    All other components within normal limits  RESP PANEL BY RT-PCR (RSV, FLU A&B, COVID)  RVPGX2  RAPID URINE DRUG SCREEN, HOSP PERFORMED  MAGNESIUM  LIPASE, BLOOD  CK  TROPONIN I (HIGH SENSITIVITY)  TROPONIN I (HIGH SENSITIVITY)    CT Angio Chest PE W  and/or Wo Contrast  Final Result    CT ABDOMEN PELVIS W CONTRAST  Final Result    DG Chest Portable 1 View  Final Result      No follow-ups on file.    Merrily Pew, MD 05/28/22 [phone]

## 2022-05-28 NOTE — ED Notes (Signed)
Pt given ginger ale.

## 2023-04-04 DIAGNOSIS — L729 Follicular cyst of the skin and subcutaneous tissue, unspecified: Secondary | ICD-10-CM | POA: Diagnosis not present

## 2023-04-04 DIAGNOSIS — M7989 Other specified soft tissue disorders: Secondary | ICD-10-CM | POA: Diagnosis not present

## 2023-04-04 DIAGNOSIS — L089 Local infection of the skin and subcutaneous tissue, unspecified: Secondary | ICD-10-CM | POA: Diagnosis not present

## 2023-07-09 ENCOUNTER — Encounter (HOSPITAL_COMMUNITY): Payer: Self-pay

## 2023-07-09 ENCOUNTER — Other Ambulatory Visit: Payer: Self-pay

## 2023-07-09 ENCOUNTER — Emergency Department (HOSPITAL_COMMUNITY)
Admission: EM | Admit: 2023-07-09 | Discharge: 2023-07-09 | Attending: Emergency Medicine | Admitting: Emergency Medicine

## 2023-07-09 DIAGNOSIS — T401X1A Poisoning by heroin, accidental (unintentional), initial encounter: Secondary | ICD-10-CM | POA: Insufficient documentation

## 2023-07-09 DIAGNOSIS — T887XXA Unspecified adverse effect of drug or medicament, initial encounter: Secondary | ICD-10-CM | POA: Diagnosis not present

## 2023-07-09 DIAGNOSIS — T40601A Poisoning by unspecified narcotics, accidental (unintentional), initial encounter: Secondary | ICD-10-CM

## 2023-07-09 LAB — CBC WITH DIFFERENTIAL/PLATELET
Abs Immature Granulocytes: 0.03 10*3/uL (ref 0.00–0.07)
Basophils Absolute: 0.1 10*3/uL (ref 0.0–0.1)
Basophils Relative: 1 %
Eosinophils Absolute: 0.1 10*3/uL (ref 0.0–0.5)
Eosinophils Relative: 1 %
HCT: 46.1 % (ref 39.0–52.0)
Hemoglobin: 15.6 g/dL (ref 13.0–17.0)
Immature Granulocytes: 0 %
Lymphocytes Relative: 27 %
Lymphs Abs: 3.4 10*3/uL (ref 0.7–4.0)
MCH: 28.2 pg (ref 26.0–34.0)
MCHC: 33.8 g/dL (ref 30.0–36.0)
MCV: 83.2 fL (ref 80.0–100.0)
Monocytes Absolute: 0.7 10*3/uL (ref 0.1–1.0)
Monocytes Relative: 6 %
Neutro Abs: 8.2 10*3/uL — ABNORMAL HIGH (ref 1.7–7.7)
Neutrophils Relative %: 65 %
Platelets: 348 10*3/uL (ref 150–400)
RBC: 5.54 MIL/uL (ref 4.22–5.81)
RDW: 14.2 % (ref 11.5–15.5)
WBC: 12.5 10*3/uL — ABNORMAL HIGH (ref 4.0–10.5)
nRBC: 0 % (ref 0.0–0.2)

## 2023-07-09 LAB — COMPREHENSIVE METABOLIC PANEL WITH GFR
ALT: 13 U/L (ref 0–44)
AST: 27 U/L (ref 15–41)
Albumin: 4.4 g/dL (ref 3.5–5.0)
Alkaline Phosphatase: 63 U/L (ref 38–126)
Anion gap: 13 (ref 5–15)
BUN: 13 mg/dL (ref 6–20)
CO2: 21 mmol/L — ABNORMAL LOW (ref 22–32)
Calcium: 10 mg/dL (ref 8.9–10.3)
Chloride: 103 mmol/L (ref 98–111)
Creatinine, Ser: 0.85 mg/dL (ref 0.61–1.24)
GFR, Estimated: >60 mL/min (ref >60)
Glucose, Bld: 128 mg/dL — ABNORMAL HIGH (ref 70–99)
Potassium: 3.1 mmol/L — ABNORMAL LOW (ref 3.5–5.1)
Sodium: 137 mmol/L (ref 135–145)
Total Bilirubin: 0.6 mg/dL (ref 0.0–1.2)
Total Protein: 7.9 g/dL (ref 6.5–8.1)

## 2023-07-09 LAB — RAPID URINE DRUG SCREEN, HOSP PERFORMED
Amphetamines: NOT DETECTED
Barbiturates: NOT DETECTED
Benzodiazepines: POSITIVE — AB
Cocaine: NOT DETECTED
Opiates: NOT DETECTED
Tetrahydrocannabinol: NOT DETECTED

## 2023-07-09 LAB — ACETAMINOPHEN LEVEL: Acetaminophen (Tylenol), Serum: <10 ug/mL — ABNORMAL LOW (ref 10–30)

## 2023-07-09 LAB — ETHANOL: Alcohol, Ethyl (B): <10 mg/dL (ref <10)

## 2023-07-09 LAB — SALICYLATE LEVEL: Salicylate Lvl: <7 mg/dL — ABNORMAL LOW (ref 7.0–30.0)

## 2023-07-09 MED ORDER — ONDANSETRON 8 MG PO TBDP
8.0000 mg | ORAL_TABLET | Freq: Once | ORAL | Status: AC
  Administered 2023-07-09: 8 mg via ORAL
  Filled 2023-07-09: qty 1

## 2023-07-09 MED ORDER — METOCLOPRAMIDE HCL 5 MG/ML IJ SOLN: 10.0000 mg | Freq: Once | INTRAMUSCULAR | Status: DC

## 2023-07-09 MED ORDER — DICYCLOMINE HCL 10 MG PO CAPS
20.0000 mg | ORAL_CAPSULE | Freq: Once | ORAL | Status: AC
  Administered 2023-07-09: 20 mg via ORAL
  Filled 2023-07-09: qty 2

## 2023-07-09 MED ORDER — KETOROLAC TROMETHAMINE 15 MG/ML IJ SOLN
15.0000 mg | Freq: Once | INTRAMUSCULAR | Status: AC
  Administered 2023-07-09: 15 mg via INTRAVENOUS
  Filled 2023-07-09: qty 1

## 2023-07-09 MED ORDER — LACTATED RINGERS IV BOLUS
1000.0000 mL | Freq: Once | INTRAVENOUS | Status: AC
  Administered 2023-07-09: 1000 mL via INTRAVENOUS

## 2023-07-09 MED ORDER — LORAZEPAM 2 MG/ML IJ SOLN
1.0000 mg | Freq: Once | INTRAMUSCULAR | Status: AC
  Administered 2023-07-09: 1 mg via INTRAVENOUS
  Filled 2023-07-09: qty 1

## 2023-07-09 MED ORDER — NAPROXEN 250 MG PO TABS
500.0000 mg | ORAL_TABLET | Freq: Once | ORAL | Status: AC
  Administered 2023-07-09: 500 mg via ORAL
  Filled 2023-07-09: qty 2

## 2023-07-09 MED ORDER — LORAZEPAM 2 MG/ML IJ SOLN: 0.5000 mg | Freq: Once | INTRAMUSCULAR | Status: DC

## 2023-07-09 MED ORDER — ONDANSETRON HCL 4 MG/2ML IJ SOLN
4.0000 mg | Freq: Once | INTRAMUSCULAR | Status: AC
  Administered 2023-07-09: 4 mg via INTRAVENOUS
  Filled 2023-07-09: qty 2

## 2023-07-09 NOTE — ED Notes (Signed)
 Patient ambulatory to bathroom for BM. Reports normal BM; denies any blood or diarrhea. Also reports abd pain that started after Narcan administration. Pt also reports he is "coming off of heroin"; last use 07/07/23. Denies any other drug use since then

## 2023-07-09 NOTE — ED Provider Notes (Signed)
 [place] EMERGENCY DEPARTMENT AT [facility] Provider Note   CSN: [id number] Arrival date & time: 07/09/23  0016     History  Chief Complaint  Patient presents with   possible overdose    Eddie Perry is a 28 y.o. male with PMH as listed below who presents with opiate overdose.  Patient from jail for possible overdose. EMS reports guard found the patient unresponsive in the bathroom on his rounds. Nurse at jail administered a total of 4mg  of Narcan IN, with responsiveness returning within 5 mins. Patient states he used heroin, denies other ingestions. States unintentional overdose. Patient endorses nausea, back pain, anxiety/agitation, body aches. He states he was previously in his NSOH. Denies flu-like sxs, chest pain, SOB, urinary sxs.   No past medical history on file.     Home Medications Prior to Admission medications   Medication Sig Start Date End Date Taking? Authorizing Provider  chlordiazePOXIDE (LIBRIUM) 25 MG capsule 50mg  PO TID x 1D, then 25-50mg  PO BID X 1D, then 25-50mg  PO QD X 1D 05/28/22   Mesner, Barbara Cower, MD  HYDROcodone-acetaminophen (NORCO/VICODIN) 5-325 MG tablet Take one tab po q 4 hrs prn pain 07/03/19   Triplett, Tammy, PA-C  ibuprofen (ADVIL) 200 MG tablet Take 800 mg by mouth daily as needed for mild pain or moderate pain.    [provider]  loperamide (IMODIUM) 2 MG capsule Take 1 capsule (2 mg total) by mouth 4 (four) times daily as needed for diarrhea or loose stools. 05/28/22   Mesner, Barbara Cower, MD  ondansetron (ZOFRAN-ODT) 4 MG disintegrating tablet 4mg  ODT q4 hours prn nausea/vomit 05/28/22   Mesner, Barbara Cower, MD      Allergies    Patient has no known allergies.    Review of Systems   Review of Systems A 10 point review of systems was performed and is negative unless otherwise reported in HPI.  Physical Exam Updated Vital Signs BP (!) 137/94   Pulse 64   Temp 98.2 F (36.8 C) (Oral)   Resp 10   Ht 5\' 7"  (1.702 m)   Wt 60.3 kg    SpO2 99%   BMI 20.83 kg/m  Physical Exam General: Uncomfortable appearing male, lying in bed.  HEENT: PERRLA midrange, EOMI, no nystagmus, Sclera anicteric, MMM, trachea midline.  Cardiology: RRR, no murmurs/rubs/gallops. BL radial and DP pulses equal bilaterally.  Resp: Normal respiratory rate and effort. CTAB, no wheezes, rhonchi, crackles.  Abd: Soft, non-tender, non-distended. No rebound tenderness or guarding.  GU: Deferred. MSK: No peripheral edema or signs of trauma.  Skin: warm, dry.  Back: No midline C spine TTP Neuro: sleepy but oriented x4, CNs II-XII grossly intact. MAEs. Sensation grossly intact.   ED Results / Procedures / Treatments   Labs (all labs ordered are listed, but only abnormal results are displayed) Labs Reviewed  ACETAMINOPHEN LEVEL - Abnormal; Notable for the following components:      Result Value   Acetaminophen (Tylenol), Serum <10 (*)    All other components within normal limits  SALICYLATE LEVEL - Abnormal; Notable for the following components:   Salicylate Lvl <7.0 (*)    All other components within normal limits  CBC WITH DIFFERENTIAL/PLATELET - Abnormal; Notable for the following components:   WBC 12.5 (*)    Neutro Abs 8.2 (*)    All other components within normal limits  COMPREHENSIVE METABOLIC PANEL WITH GFR - Abnormal; Notable for the following components:   Potassium 3.1 (*)    CO2  21 (*)    Glucose, Bld 128 (*)    All other components within normal limits  ETHANOL  RAPID URINE DRUG SCREEN, HOSP PERFORMED    EKG EKG Interpretation Date/Time:  Thursday July 09 2023 00:36:28 EDT Ventricular Rate:  75 PR Interval:  147 QRS Duration:  113 QT Interval:  529 QTC Calculation: 591 R Axis:   95  Text Interpretation: Sinus rhythm Incomplete right bundle branch block ST elev, probable normal early repol pattern Prolonged QT interval Similar to prior Confirmed by Vivi Barrack [phone]) on 07/09/2023 2:53:37 AM  Radiology No results  found.  Procedures Procedures    Medications Ordered in ED Medications  ondansetron (ZOFRAN) injection 4 mg (has no administration in time range)  lactated ringers bolus 1,000 mL (has no administration in time range)  ketorolac (TORADOL) 15 MG/ML injection 15 mg (has no administration in time range)    ED Course/ Medical Decision Making/ A&P                          Medical Decision Making Amount and/or Complexity of Data Reviewed Labs: ordered.  Risk Prescription drug management.    This patient presents to the ED for concern of overdose requiring narcan, this involves an extensive number of treatment options, and is a complaint that carries with it a high risk of complications and morbidity.  I considered the following differential and admission for this acute, potentially life threatening condition.   MDM:    Patient sleepy but oriented, answering questions, protecting his airway. Accidental overdose on opiates. Possibly feeling effects of acute opiate withdrawal d/t narcan administration. Given zofran, fluids, and toradol, and subsequently ativan for nausea as well as QTc was prolonged on EKG. Basic labs demonstrate leukocytosis, neg EtOH/tylenol/salicylate, UDS +benzos, K 3.1, otherwise unremarkable. He was otherwise in his NSOH. Has been observed here for >5 hours. Will be given PO trial after ativan and likely can be discharged.    Clinical Course as of 07/15/23 0442  Thu Jul 09, 2023  0603 Patient reevaluated. HDS, no tachycardia or hypertension. Breathing comfortably, not tachypneic. States he feels very nauseated. Given prolonged QT, will give ativan IV.  [HN]    Clinical Course User Index [HN] Loetta Rough, MD    Labs: I Ordered, and personally interpreted labs.  The pertinent results include:  those listed above  Additional history obtained from chart review, police.  Cardiac Monitoring: The patient was maintained on a cardiac monitor.  I personally viewed  and interpreted the cardiac monitored which showed an underlying rhythm of: NSR  Reevaluation: After the interventions noted above, I reevaluated the patient and found that they have :improved  Social Determinants of Health: In jail  Disposition:  Patient is signed out to the oncoming ED physician Dr. Charm Barges who is made aware of his history, presentation, exam, workup, and plan.    Co morbidities that complicate the patient evaluation No past medical history on file.   Medicines Meds ordered this encounter  Medications   ondansetron (ZOFRAN) injection 4 mg   lactated ringers bolus 1,000 mL   ketorolac (TORADOL) 15 MG/ML injection 15 mg    I have reviewed the patients home medicines and have made adjustments as needed  Problem List / ED Course: Problem List Items Addressed This Visit   None Visit Diagnoses       Opiate overdose, accidental or unintentional, initial encounter (HCC)    -  Primary  This note was created using dictation software, which may contain spelling or grammatical errors.    Loetta Rough, MD 07/15/23 [phone]

## 2023-07-09 NOTE — ED Triage Notes (Signed)
 Patient from jail for possible overdose. EMS reports guard found the patient unresponsive in the bathroom on his rounds. Nurse at jail administered a total of 4mg  of Narcan IN, with responsiveness returning within 5 mins. Patient states he had a seizure; EMS reports patient has not been postictal and no trauma noted to patient's tongue/incontinence. Upon arrival to ER, patient is alert and oriented, ambu

## 2023-07-09 NOTE — ED Provider Notes (Signed)
 Signout from Dr. Jearld Fenton.  28 year old male with possible opiate overdose from jail.  Received Narcan with improvement in mental status.  Continues to be nauseous here.  Has received some Ativan due to QTc prolongation.  Is attempting p.o. trial.  Plan is to follow-up results of p.o. trial. Physical Exam  BP 117/67 (BP Location: Right Arm)   Pulse 65   Temp 98 F (36.7 C) (Oral)   Resp 20   Ht 5\' 7"  (1.702 m)   Wt 60.3 kg   SpO2 97%   BMI 20.83 kg/m   Physical Exam  Procedures  Procedures  ED Course / MDM   Clinical Course as of 07/09/23 0743  Thu Jul 09, 2023  0603 Patient reevaluated. HDS, no tachycardia or hypertension. Breathing comfortably, not tachypneic. States he feels very nauseated. Given prolonged QT, will give ativan IV.  [HN]    Clinical Course User Index [HN] Loetta Rough, MD   Medical Decision Making Amount and/or Complexity of Data Reviewed Labs: ordered.  Risk Prescription drug management.   9:30 AM patient has received some medications for symptom control and is tolerating p.o.  No indications for admission at this time.       Terrilee Files, MD 07/09/23 [phone]

## 2023-07-09 NOTE — ED Notes (Signed)
 Patient placed on EtCO2 monitoring with 2L O2

## 2023-07-09 NOTE — ED Notes (Signed)
 Patient repositioned in bed, c/o abdominal pain and nausea, pt vomited. Requesting something for headache and abdominal pain 9/10. Notified EDP

## 2024-01-04 ENCOUNTER — Encounter (HOSPITAL_COMMUNITY): Payer: Self-pay | Admitting: Emergency Medicine

## 2024-01-04 ENCOUNTER — Other Ambulatory Visit: Payer: Self-pay

## 2024-01-04 ENCOUNTER — Emergency Department (HOSPITAL_COMMUNITY)
Admission: EM | Admit: 2024-01-04 | Discharge: 2024-01-05 | Disposition: A | Discharge status: Home / Self Care | Attending: Emergency Medicine | Admitting: Emergency Medicine

## 2024-01-04 DIAGNOSIS — Z4800 Encounter for change or removal of nonsurgical wound dressing: Secondary | ICD-10-CM | POA: Insufficient documentation

## 2024-01-04 DIAGNOSIS — Z5189 Encounter for other specified aftercare: Secondary | ICD-10-CM

## 2024-01-04 NOTE — ED Triage Notes (Signed)
 Pt c/o head wound x one week.

## 2024-01-05 MED ORDER — CEPHALEXIN 500 MG PO CAPS: 500.0000 mg | ORAL_CAPSULE | Freq: Four times a day (QID) | ORAL | 0 refills | Status: AC

## 2024-01-05 NOTE — ED Provider Notes (Signed)
 [place] EM[facility]ENCY DEPARTMENT AT [facility] Provider Note   CSN: [id number] Arrival date & time: 01/04/24  2342     Patient presents with: Wound Check   Eddie Perry is a 28 y.o. male.   HPI     This is a 28 year old male who presents from jail with concerns for a head wound.  Patient reports 1 week ago he noted some on the back of his scalp.  He began to pick at it.  Noted some drainage.  Was evaluated by the jail nurse who packed it and wanted him to be evaluated.  Patient states he did not note anything prior to 1 week ago.  He has not had any fevers or systemic symptoms.  Prior to Admission medications   Medication Sig Start Date End Date Taking? Authorizing Provider  cephALEXin (KEFLEX) 500 MG capsule Take 1 capsule (500 mg total) by mouth 4 (four) times daily. 01/05/24  Yes Emari Demmer, Charmaine FALCON, MD  chlordiazePOXIDE  (LIBRIUM ) 25 MG capsule 50mg  PO TID x 1D, then 25-50mg  PO BID X 1D, then 25-50mg  PO QD X 1D 05/28/22   Mesner, Selinda, MD  HYDROcodone -acetaminophen  (NORCO/VICODIN) 5-325 MG tablet Take one tab po q 4 hrs prn pain 07/03/19   Triplett, Tammy, PA-C  ibuprofen  (ADVIL ) 200 MG tablet Take 800 mg by mouth daily as needed for mild pain or moderate pain.    [provider]  loperamide  (IMODIUM ) 2 MG capsule Take 1 capsule (2 mg total) by mouth 4 (four) times daily as needed for diarrhea or loose stools. 05/28/22   Mesner, Selinda, MD  ondansetron  (ZOFRAN -ODT) 4 MG disintegrating tablet 4mg  ODT q4 hours prn nausea/vomit 05/28/22   Mesner, Jason, MD    Allergies: Patient has no known allergies.    Review of Systems  Constitutional:  Negative for fever.  Skin:  Positive for wound.  All other systems reviewed and are negative.   Updated Vital Signs Ht 1.702 m (5' 7)   Wt 60.3 kg   BMI 20.82 kg/m   Physical Exam Vitals and nursing note reviewed.  Constitutional:      Appearance: He is well-developed. He is not ill-appearing.  HENT:     Head:  Normocephalic.     Comments: 1 cm open defect posterior scalp with involuted edges, no adjacent erythema, deep with some purulence, no fluctuance or induration Eyes:     Pupils: Pupils are equal, round, and reactive to light.  Cardiovascular:     Rate and Rhythm: Normal rate and regular rhythm.  Pulmonary:     Effort: Pulmonary effort is normal. No respiratory distress.  Abdominal:     Palpations: Abdomen is soft.     Tenderness: There is no abdominal tenderness.  Musculoskeletal:     Cervical back: Neck supple.  Lymphadenopathy:     Cervical: No cervical adenopathy.  Skin:    General: Skin is warm and dry.  Neurological:     Mental Status: He is alert and oriented to person, place, and time.  Psychiatric:        Mood and Affect: Mood normal.     (all labs ordered are listed, but only abnormal results are displayed) Labs Reviewed - No data to display  EKG: None  Radiology: No results found.   Procedures   Medications Ordered in the ED - No data to display  Medical Decision Making Risk Prescription drug management.   This patient presents to the ED for concern of head wound, this involves an extensive number of treatment options, and is a complaint that carries with it a high risk of complications and morbidity.  I considered the following differential and admission for this acute, potentially life threatening condition.  The differential diagnosis includes abscess, cellulitis, open wound  MDM:    This is a 28 year old male who presents with a head wound.  Reports picking at a scab for 1 week.  There was some noted purulence.  Wound is clean and edges are involuted.  I suspect chronicity of this is greater than 1 week at this point.  No fluctuance or induration.  Unclear what this started as but could potentially have been an abscess that opened up on its own.  Recommend wet-to-dry dressings with antibiotic ointment.  Will place on  Keflex.  Discussed with the patient that he needs to keep the wound clean and dry.  (Labs, imaging, consults)  Labs: I Ordered, and personally interpreted labs.  The pertinent results include: None  Imaging Studies ordered: I ordered imaging studies including none I independently visualized and interpreted imaging. I agree with the radiologist interpretation  Additional history obtained from chart review.  External records from outside source obtained and reviewed including prior evaluations  Cardiac Monitoring: The patient was not maintained on a cardiac monitor.  If on the cardiac monitor, I personally viewed and interpreted the cardiac monitored which showed an underlying rhythm of: N/A  Reevaluation: After the interventions noted above, I reevaluated the patient and found that they have :stayed the same  Social Determinants of Health:  currently incarcerated  Disposition: Discharge  Co morbidities that complicate the patient evaluation History reviewed. No pertinent past medical history.   Medicines Meds ordered this encounter  Medications   cephALEXin (KEFLEX) 500 MG capsule    Sig: Take 1 capsule (500 mg total) by mouth 4 (four) times daily.    Dispense:  20 capsule    Refill:  0    I have reviewed the patients home medicines and have made adjustments as needed  Problem List / ED Course: Problem List Items Addressed This Visit   None Visit Diagnoses       Visit for wound check    -  Primary                Final diagnoses:  Visit for wound check    ED Discharge Orders          Ordered    cephALEXin (KEFLEX) 500 MG capsule  4 times daily        01/05/24 0005               Annell Canty, Charmaine FALCON, MD 01/05/24 0009

## 2024-01-05 NOTE — Discharge Instructions (Signed)
 You were seen today for a wound check.  You may have had an abscess or other lesion that has opened up.  You have a fairly large defect in the posterior scalp.  Keep clean and dry.  Apply antibiotic ointment and keep covered.  You will be given a short course of antibiotic to cover for any potential infection.  There is some indication that this may have been there for longer than 1 week.  He will need to have it rechecked if the wound does not heal appropriately.
